# Patient Record
Sex: Male | Born: 1950 | Hispanic: No | Marital: Married | Smoking: Former smoker
Health system: Southern US, Community
[De-identification: ages and names within clinical notes are randomized; demographics above are authoritative.]

## PROBLEM LIST (undated history)

## (undated) DIAGNOSIS — E785 Hyperlipidemia, unspecified: Secondary | ICD-10-CM

## (undated) DIAGNOSIS — I519 Heart disease, unspecified: Secondary | ICD-10-CM

## (undated) DIAGNOSIS — I4510 Unspecified right bundle-branch block: Secondary | ICD-10-CM

## (undated) DIAGNOSIS — I2510 Atherosclerotic heart disease of native coronary artery without angina pectoris: Secondary | ICD-10-CM

## (undated) DIAGNOSIS — I219 Acute myocardial infarction, unspecified: Secondary | ICD-10-CM

## (undated) DIAGNOSIS — I10 Essential (primary) hypertension: Secondary | ICD-10-CM

## (undated) HISTORY — DX: Atherosclerotic heart disease of native coronary artery without angina pectoris: I25.10

## (undated) HISTORY — DX: Heart disease, unspecified: I51.9

## (undated) HISTORY — DX: Hyperlipidemia, unspecified: E78.5

## (undated) HISTORY — DX: Unspecified right bundle-branch block: I45.10

---

## 2012-01-27 ENCOUNTER — Encounter (HOSPITAL_COMMUNITY): Payer: Self-pay | Admitting: Emergency Medicine

## 2012-01-27 ENCOUNTER — Emergency Department (HOSPITAL_COMMUNITY)
Admission: EM | Admit: 2012-01-27 | Discharge: 2012-01-27 | Disposition: A | Discharge status: Home / Self Care | Payer: 59 | Attending: Emergency Medicine | Admitting: Emergency Medicine

## 2012-01-27 DIAGNOSIS — N289 Disorder of kidney and ureter, unspecified: Secondary | ICD-10-CM

## 2012-01-27 DIAGNOSIS — Z79899 Other long term (current) drug therapy: Secondary | ICD-10-CM | POA: Insufficient documentation

## 2012-01-27 DIAGNOSIS — R5381 Other malaise: Secondary | ICD-10-CM | POA: Insufficient documentation

## 2012-01-27 DIAGNOSIS — Z7982 Long term (current) use of aspirin: Secondary | ICD-10-CM | POA: Insufficient documentation

## 2012-01-27 DIAGNOSIS — R6883 Chills (without fever): Secondary | ICD-10-CM | POA: Insufficient documentation

## 2012-01-27 DIAGNOSIS — R42 Dizziness and giddiness: Secondary | ICD-10-CM | POA: Insufficient documentation

## 2012-01-27 DIAGNOSIS — E7800 Pure hypercholesterolemia, unspecified: Secondary | ICD-10-CM | POA: Insufficient documentation

## 2012-01-27 DIAGNOSIS — I10 Essential (primary) hypertension: Secondary | ICD-10-CM | POA: Insufficient documentation

## 2012-01-27 DIAGNOSIS — I252 Old myocardial infarction: Secondary | ICD-10-CM | POA: Insufficient documentation

## 2012-01-27 HISTORY — DX: Acute myocardial infarction, unspecified: I21.9

## 2012-01-27 HISTORY — DX: Essential (primary) hypertension: I10

## 2012-01-27 LAB — BASIC METABOLIC PANEL
BUN: 30 mg/dL — ABNORMAL HIGH (ref 6–23)
Calcium: 9.1 mg/dL (ref 8.4–10.5)
Creatinine, Ser: 1.68 mg/dL — ABNORMAL HIGH (ref 0.50–1.35)
GFR calc Af Amer: 50 mL/min — ABNORMAL LOW (ref >90)
GFR calc non Af Amer: 43 mL/min — ABNORMAL LOW (ref >90)
Glucose, Bld: 146 mg/dL — ABNORMAL HIGH (ref 70–99)
Potassium: 3.4 mEq/L — ABNORMAL LOW (ref 3.5–5.1)

## 2012-01-27 LAB — CBC
HCT: 43.2 % (ref 39.0–52.0)
Hemoglobin: 13.7 g/dL (ref 13.0–17.0)
MCH: 20.3 pg — ABNORMAL LOW (ref 26.0–34.0)
MCHC: 31.7 g/dL (ref 30.0–36.0)
MCV: 64 fL — ABNORMAL LOW (ref 78.0–100.0)
RDW: 18.9 % — ABNORMAL HIGH (ref 11.5–15.5)

## 2012-01-27 LAB — POCT I-STAT TROPONIN I

## 2012-01-27 MED ORDER — SODIUM CHLORIDE 0.9 % IV SOLN: Freq: Once | INTRAVENOUS | Status: AC

## 2012-01-27 MED ORDER — SODIUM CHLORIDE 0.9 % IV BOLUS (SEPSIS)
1000.0000 mL | Freq: Once | INTRAVENOUS | Status: AC
  Administered 2012-01-27: 1000 mL via INTRAVENOUS

## 2012-01-27 NOTE — ED Notes (Addendum)
Pt states he thinks he is having a heart attack, c/o dizziness and weakness, denies chest pain but states he had a MI last June with no chest pain, states this feels similar except he is not having jaw pain. Pt to triage for EKG.

## 2012-01-27 NOTE — ED Notes (Signed)
Dr. Weldon Inches in to talk to pt and family.  Pt denies any chest pain at this time.

## 2012-01-27 NOTE — ED Provider Notes (Addendum)
History     CSN: [id number]  Arrival date & time 01/27/12  4098   First MD Initiated Contact with Patient 01/27/12 2008      Chief Complaint  Patient presents with  . Fatigue    (Consider location/radiation/quality/duration/timing/severity/associated sxs/prior treatment) The history is provided by the patient.   the patient is a 61 year old, male, with a history of myocardial infarction, hypertension, and hypercholesterolemia.  He presents emergency department for violation after he had a shaking chill today, about 2:00 in afternoon.  He denied pain anywhere.  He denied nausea, vomiting.  He did not have any shortness of breath, or sweating.  He has not had a cough.  He has not been sick over the past few, days.  Because he has a history of myocardial infarction, that presented as only, jaw pain.  His wife, was concerned and insisted that he come to the emergency department for evaluation.  Presently, he is asymptomatic.  He does not smoke cigarettes.  Past Medical History  Diagnosis Date  . MI (myocardial infarction)   . Hypertension     History reviewed. No pertinent past surgical history.  No family history on file.  History  Substance Use Topics  . Smoking status: Former Games developer  . Smokeless tobacco: Not on file  . Alcohol Use: No      Review of Systems  Constitutional: Positive for chills. Negative for fever.  HENT: Negative for congestion.   Respiratory: Negative for cough, chest tightness and shortness of breath.   Cardiovascular: Negative for chest pain and palpitations.  Gastrointestinal: Negative for nausea, vomiting and abdominal pain.  Skin: Negative for rash.  Neurological: Positive for light-headedness. Negative for headaches.  Psychiatric/Behavioral: Negative for confusion.  All other systems reviewed and are negative.    Allergies  Review of patient's allergies indicates no known allergies.  Home Medications   Current Outpatient Rx  Name Route  Sig Dispense Refill  . ALLOPURINOL 300 MG PO TABS Oral Take 300 mg by mouth daily.    . ASPIRIN 325 MG PO TABS Oral Take 325 mg by mouth daily.    . ATORVASTATIN CALCIUM 40 MG PO TABS Oral Take 40 mg by mouth daily.    Marland Kitchen CLOPIDOGREL BISULFATE 75 MG PO TABS Oral Take 75 mg by mouth daily.    . COLCHICINE 0.6 MG PO TABS Oral Take 0.6 mg by mouth daily.    Marland Kitchen PEPCID AC PO Oral Take 1 tablet by mouth daily.    . OMEGA-3 FATTY ACIDS 1000 MG PO CAPS Oral Take 2 g by mouth daily.    Marland Kitchen HYDROCHLOROTHIAZIDE 12.5 MG PO CAPS Oral Take 12.5 mg by mouth daily.    Marland Kitchen METOPROLOL TARTRATE 50 MG PO TABS Oral Take 25 mg by mouth 2 (two) times daily.    Marland Kitchen OLMESARTAN MEDOXOMIL 40 MG PO TABS Oral Take 40 mg by mouth daily.      BP 153/93  Pulse 80  Temp 98.3 F (36.8 C) (Oral)  Resp 24  SpO2 99%  Physical Exam  Nursing note and vitals reviewed. Constitutional: He is oriented to person, place, and time. He appears well-developed and well-nourished. No distress.  HENT:  Head: Normocephalic and atraumatic.  Eyes: Conjunctivae are normal.  Neck: Normal range of motion. Neck supple.  Cardiovascular: Normal rate.   No murmur heard. Pulmonary/Chest: Effort normal. He has no rales.  Abdominal: Soft. Bowel sounds are normal. He exhibits no distension. There is no tenderness.  Musculoskeletal: Normal range of  motion. He exhibits no edema.  Neurological: He is alert and oriented to person, place, and time.  Skin: Skin is warm and dry.  Psychiatric: He has a normal mood and affect. Thought content normal.    ED Course  Procedures (including critical care time)  Labs Reviewed  CBC - Abnormal; Notable for the following:    WBC 12.0 (*)     RBC 6.75 (*)     MCV 64.0 (*)     MCH 20.3 (*)     RDW 18.9 (*)     All other components within normal limits  BASIC METABOLIC PANEL - Abnormal; Notable for the following:    Potassium 3.4 (*)     Glucose, Bld 146 (*)     BUN 30 (*)     Creatinine, Ser 1.68 (*)      GFR calc non Af Amer 43 (*)     GFR calc Af Amer 50 (*)     All other components within normal limits  GLUCOSE, CAPILLARY - Abnormal; Notable for the following:    Glucose-Capillary 131 (*)     All other components within normal limits  POCT I-STAT TROPONIN I   No results found.   1. Renal insufficiency    ECG. Normal sinus rhythm at 78 beats per minute. Normal axis. Left atrial enlargement. Q waves inferiorly. Normal sinus rhythm with left atrial enlargement, and inferior Q waves, no dysrhythmias or signs of acute ischemia   MDM  Chills Mild renal insufficiency No evidence of ACS.  No pain anywhere.  The patient is in no distress.  His physical examination is completely normal.  There is no indication for admission to the hospital.        Cheri Guppy, MD 01/28/12 0025  Cheri Guppy, MD 03/02/12 1038

## 2012-01-27 NOTE — ED Notes (Signed)
Pt reports feeling tired and decreased appetite since 4pm.  States he had R sided chest pain that lasted approx 1 minute this morning.  Reports diaphoresis.

## 2012-01-27 NOTE — Discharge Instructions (Signed)
Your laboratory test show mild decrease in the function of your kidneys.  There is no evidence that she had had a heart attack.  Followup with the cardiologist, and internal medicine doctors for reevaluation.  Call for appointment time.  Return for worse symptoms, especially, pain.  In your jaw, or chest pain

## 2012-01-27 NOTE — ED Notes (Signed)
cbg 131 

## 2012-01-27 NOTE — ED Notes (Addendum)
Pt stated he very sleepy, slept for 30-40 minis, went to [employer] on way home drink 1/2 can coke and suddenly become very sweaty. Pt stated has been tired and very fatigue since 2 pm today. Took 2tab/325 ASA. Has a hx of heart attack in past and did not have any CP at that time. Pt stated lost of appetite,  Complain of abdominal pain.  Pt denies any cheat pain now

## 2012-03-24 HISTORY — PX: TRANSTHORACIC ECHOCARDIOGRAM: SHX275

## 2012-09-22 ENCOUNTER — Other Ambulatory Visit (HOSPITAL_COMMUNITY): Payer: Self-pay | Admitting: Cardiovascular Disease

## 2012-09-22 DIAGNOSIS — R0602 Shortness of breath: Secondary | ICD-10-CM

## 2012-09-22 DIAGNOSIS — I2510 Atherosclerotic heart disease of native coronary artery without angina pectoris: Secondary | ICD-10-CM

## 2012-10-01 ENCOUNTER — Ambulatory Visit (HOSPITAL_COMMUNITY)
Admission: RE | Admit: 2012-10-01 | Discharge: 2012-10-01 | Disposition: A | Discharge status: Home / Self Care | Payer: 59 | Source: Ambulatory Visit | Attending: Cardiovascular Disease | Admitting: Cardiovascular Disease

## 2012-10-01 DIAGNOSIS — I2510 Atherosclerotic heart disease of native coronary artery without angina pectoris: Secondary | ICD-10-CM

## 2012-10-01 DIAGNOSIS — R0602 Shortness of breath: Secondary | ICD-10-CM

## 2012-10-10 ENCOUNTER — Encounter (HOSPITAL_COMMUNITY): Payer: 59

## 2012-10-17 ENCOUNTER — Encounter (HOSPITAL_COMMUNITY): Payer: 59

## 2013-02-10 ENCOUNTER — Other Ambulatory Visit: Payer: Self-pay | Admitting: *Deleted

## 2013-02-10 MED ORDER — ATORVASTATIN CALCIUM 40 MG PO TABS: 40.0000 mg | ORAL_TABLET | Freq: Every day | ORAL | Status: DC

## 2013-02-10 NOTE — Telephone Encounter (Signed)
Rx was sent to pharmacy electronically. 

## 2013-02-12 ENCOUNTER — Other Ambulatory Visit: Payer: Self-pay | Admitting: Pharmacist Clinician (PhC)/ Clinical Pharmacy Specialist

## 2013-02-12 MED ORDER — ATORVASTATIN CALCIUM 40 MG PO TABS: 40.0000 mg | ORAL_TABLET | Freq: Every day | ORAL | Status: DC

## 2013-03-19 ENCOUNTER — Ambulatory Visit (HOSPITAL_COMMUNITY)
Admission: RE | Admit: 2013-03-19 | Discharge: 2013-03-19 | Disposition: A | Discharge status: Home / Self Care | Payer: 59 | Source: Ambulatory Visit | Attending: Cardiovascular Disease | Admitting: Cardiovascular Disease

## 2013-03-19 VITALS — Ht 72.0 in | Wt 172.0 lb

## 2013-03-19 DIAGNOSIS — I2510 Atherosclerotic heart disease of native coronary artery without angina pectoris: Secondary | ICD-10-CM

## 2013-03-19 DIAGNOSIS — R0602 Shortness of breath: Secondary | ICD-10-CM

## 2013-03-19 HISTORY — PX: CARDIOVASCULAR STRESS TEST: SHX262

## 2013-03-19 MED ORDER — TECHNETIUM TC 99M SESTAMIBI GENERIC - CARDIOLITE
30.0000 | Freq: Once | INTRAVENOUS | Status: AC | PRN
  Administered 2013-03-19: 30 via INTRAVENOUS

## 2013-03-19 MED ORDER — REGADENOSON 0.4 MG/5ML IV SOLN
0.4000 mg | Freq: Once | INTRAVENOUS | Status: AC
  Administered 2013-03-19: 0.4 mg via INTRAVENOUS

## 2013-03-19 MED ORDER — AMINOPHYLLINE 25 MG/ML IV SOLN
75.0000 mg | Freq: Once | INTRAVENOUS | Status: AC
  Administered 2013-03-19: 75 mg via INTRAVENOUS

## 2013-03-19 MED ORDER — TECHNETIUM TC 99M SESTAMIBI GENERIC - CARDIOLITE
10.0000 | Freq: Once | INTRAVENOUS | Status: AC | PRN
  Administered 2013-03-19: 10 via INTRAVENOUS

## 2013-03-19 NOTE — Procedures (Addendum)
[facility] [address] [place] [address] [phone]  Cardiology Nuclear Med Study  Daniel Hurst is a 62 y.o. male     MRN : [id number]     DOB: 20-Apr-1951  Procedure Date: 03/19/2013  Nuclear Med Background Indication for Stress Test:  PTCA Patency History:  MI 2012, PTCA 2012 Cardiac Risk Factors: Family History - CAD, History of Smoking, Hypertension and Lipids  Symptoms:  Chest Pain, DOE and SOB   Nuclear Pre-Procedure Caffeine/Decaff Intake:  7:00pm NPO After: 5:00am   IV Site: R Antecubital  IV 0.9% NS with Angio Cath:  22g  Chest Size (in):  40 IV Started by: Koren Shiver, CNMT  Height: 6' (1.829 m)  Cup Size: n/a  BMI:  Body mass index is 23.32 kg/(m^2). Weight:  172 lb (78.019 kg)   Tech Comments:  N/a     Nuclear Med Study 1 or 2 day study: 1 day  Stress Test Type:  Stress  Order Authorizing Provider:  Nanetta Batty, MD   Resting Radionuclide: Technetium 7m Sestamibi  Resting Radionuclide Dose: 10.8 mCi   Stress Radionuclide:  Technetium 5m Sestamibi  Stress Radionuclide Dose: 31.8 mCi           Stress Protocol Rest HR: 61 Stress HR: 83  Rest BP: 150/104 Stress BP: 173/109  Exercise Time (min): n/a METS: n/a          Dose of Adenosine (mg):  n/a Dose of Lexiscan: 0.4 mg  Dose of Atropine (mg): n/a Dose of Dobutamine: n/a mcg/kg/min (at max HR)  Stress Test Technologist: Ernestene Mention, CCT Nuclear Technologist: Koren Shiver, CNMT   Rest Procedure:  Myocardial perfusion imaging was performed at rest 45 minutes following the intravenous administration of Technetium 1m Sestamibi. Stress Procedure:  The patient received IV Lexiscan 0.4 mg over 15-seconds.  Technetium 14m Sestamibi injected at 30-seconds.  Due to patient's shortness of breath, dizziness, feeling flushed and stomach pains, he was given IV Aminophylline 75 mg. Symptoms were resolved during recovery. There were no significant  changes with Lexiscan.  Quantitative spect images were obtained after a 45 minute delay.  Transient Ischemic Dilatation (Normal <1.22):  1.06 Lung/Heart Ratio (Normal <0.45):  0.30 QGS EDV:  198 ml QGS ESV:  133 ml LV Ejection Fraction: 33%     Rest ECG: NSR, old inferior infarct  Stress ECG: No significant change from baseline ECG  QPS Raw Data Images:  Normal; no motion artifact; normal heart/lung ratio. Stress Images:  Extensive and severe perfusion defect in the inferior wall, inferior septum and apex. Rest Images:  Comparison with the stress images reveals no significant change. Subtraction (SDS):  No evidence of ischemia.  Impression Exercise Capacity:  Lexiscan with no exercise. BP Response:  Normal blood pressure response. Clinical Symptoms:  There is dyspnea. ECG Impression:  No significant ST segment change suggestive of ischemia. Comparison with Prior Nuclear Study: No significant change from previous study  Overall Impression:  Low risk stress nuclear study extensive inferior, inferoseptal and apical scar with little, if any, periinfarct ischemia..  LV Wall Motion:  Moderately depressed LV systolic function with global hypokinesis, probably apical dyskinesis.   Thurmon Fair, MD  03/19/2013 1:21 PM

## 2013-03-20 ENCOUNTER — Encounter: Payer: Self-pay | Admitting: Cardiovascular Disease

## 2013-04-02 ENCOUNTER — Encounter: Payer: Self-pay | Admitting: Physician Assistant

## 2013-04-02 DIAGNOSIS — I10 Essential (primary) hypertension: Secondary | ICD-10-CM | POA: Insufficient documentation

## 2013-04-02 DIAGNOSIS — E785 Hyperlipidemia, unspecified: Secondary | ICD-10-CM | POA: Insufficient documentation

## 2013-04-02 DIAGNOSIS — I2510 Atherosclerotic heart disease of native coronary artery without angina pectoris: Secondary | ICD-10-CM

## 2013-04-09 ENCOUNTER — Encounter: Payer: Self-pay | Admitting: Cardiovascular Disease

## 2013-04-09 ENCOUNTER — Ambulatory Visit (INDEPENDENT_AMBULATORY_CARE_PROVIDER_SITE_OTHER): Payer: 59 | Admitting: Cardiovascular Disease

## 2013-04-09 VITALS — BP 138/96 | HR 60 | Ht 67.0 in | Wt 173.0 lb

## 2013-04-09 DIAGNOSIS — I2510 Atherosclerotic heart disease of native coronary artery without angina pectoris: Secondary | ICD-10-CM

## 2013-04-09 DIAGNOSIS — I10 Essential (primary) hypertension: Secondary | ICD-10-CM

## 2013-04-09 DIAGNOSIS — E785 Hyperlipidemia, unspecified: Secondary | ICD-10-CM

## 2013-04-09 DIAGNOSIS — Z79899 Other long term (current) drug therapy: Secondary | ICD-10-CM

## 2013-04-09 MED ORDER — CARVEDILOL 6.25 MG PO TABS: 9.3750 mg | ORAL_TABLET | Freq: Two times a day (BID) | ORAL | Status: DC

## 2013-04-09 MED ORDER — ISOSORBIDE MONONITRATE ER 30 MG PO TB24: 30.0000 mg | ORAL_TABLET | Freq: Every day | ORAL | Status: DC

## 2013-04-09 NOTE — Progress Notes (Signed)
04/09/2013 Daniel Hurst   1951/07/13  [id number]  Primary Physician Pcp Not In System Primary Cardiologist: Daniel Gess MD Daniel Hurst   HPI:  The patient is a 62 year old thin-appearing married Asian male father of 2 who works as an Art gallery manager at [employer]. I last saw him 6 months ago after being referred by Daniel Hurst, ER physician a [facility], after presenting January 27, 2012, with shaking chills. His risk factors include hypertension, hyperlipidemia, and family history. He did have a myocardial infarction in June 2012 and underwent cardiac catheterization by Daniel Hurst on January 30, 2011, revealing an occluded RCA, an occluded LAD, and a patent circumflex and EF of 25%. He had a Myoview performed April 17, 2011, that showed scar in multiple territories. Echo performed in our office March 14, 2012, showed an EF of 35% to 45% with severe inferior wall hypokinesia as well as posterior wall hypokinesia. He had a severely dilated left atrium with a suspected patent foramen ovale. His other problems include hypertension, hyperlipidemia, and family history. He denies chest pain but does have significant dyspnea on exertion. Lipid profile performed March 25, 2012, revealed a total cholesterol of 94, LDL of 40, and HDL of 33. Since I saw him back in the office 09/17/12 he has done well. He denies chest pain and has mild dyspnea on exertion but this has not [place] limiting. This exercise frequently. A recent Myoview stress test performed last month showed scar in the LAD and RCA territories with without ischemia. The ejection fraction by quantitative gated SPECT was 33%.    Current Outpatient Prescriptions  Medication Sig Dispense Refill  . amLODipine (NORVASC) 5 MG tablet Take 5 mg by mouth daily.      Marland Kitchen aspirin 325 MG tablet Take 325 mg by mouth daily.      Marland Kitchen atorvastatin (LIPITOR) 40 MG tablet Take 1 tablet (40 mg total) by mouth daily.  30 tablet  2  . carvedilol  (COREG) 6.25 MG tablet Take 6.25 mg by mouth 2 (two) times daily.      . clopidogrel (PLAVIX) 75 MG tablet Take 75 mg by mouth daily.      . colchicine 0.6 MG tablet Take 0.6 mg by mouth daily.      . Famotidine (PEPCID AC PO) Take 1 tablet by mouth daily.      . fish oil-omega-3 fatty acids 1000 MG capsule Take 2 g by mouth daily.      . furosemide (LASIX) 40 MG tablet Take 40 mg by mouth daily.      Marland Kitchen losartan (COZAAR) 100 MG tablet Take 100 mg by mouth daily.       No current facility-administered medications for this visit.    No Known Allergies  History   Social History  . Marital Status: Married    Spouse Name: N/A    Number of Children: N/A  . Years of Education: N/A   Occupational History  . Not on file.   Social History Main Topics  . Smoking status: Former Games developer  . Smokeless tobacco: Not on file  . Alcohol Use: No  . Drug Use: No  . Sexual Activity:    Other Topics Concern  . Not on file   Social History Narrative  . No narrative on file     Review of Systems: General: negative for chills, fever, night sweats or weight changes.  Cardiovascular: negative for chest pain, dyspnea on exertion, edema, orthopnea, palpitations, paroxysmal nocturnal dyspnea  or shortness of breath Dermatological: negative for rash Respiratory: negative for cough or wheezing Urologic: negative for hematuria Abdominal: negative for nausea, vomiting, diarrhea, bright red blood per rectum, melena, or hematemesis Neurologic: negative for visual changes, syncope, or dizziness All other systems reviewed and are otherwise negative except as noted above.    Blood pressure 138/96, pulse 60, height 5\' 7"  (1.702 m), weight 173 lb (78.472 kg).  General appearance: alert and no distress Neck: no adenopathy, no carotid bruit, no JVD, supple, symmetrical, trachea midline and thyroid not enlarged, symmetric, no tenderness/mass/nodules Lungs: clear to auscultation bilaterally Heart: regular rate  and rhythm, S1, S2 normal, no murmur, click, rub or gallop Extremities: extremities normal, atraumatic, no cyanosis or edema  EKG not performed today  ASSESSMENT AND PLAN:   CAD (coronary artery disease):  cardiac catheterization June 2012 because of MI revealed occluded RCA and occluded LAD patent circumflex. Patient recently had a Myoview stress test that showed scar in the anterior wall apex inferior apex and septum. There is no reversible ischemia. The ejection fraction was 33%. He denies chest pain. He has mild shortness of breath. Continued medical therapy will be recommended. I'm going at a low dose oral nitrate and consider at adding hydralazine as well. We'll mildly up titrate his carvedilol.  HTN (hypertension) Systolic blood pressure today was 96. Amlodipine was recently added by his PCP. I'm going to increase his carvedilol from 6.25 mg twice a day to 9.375 mg twice a day. And add a low-dose oral nitrate (immature 30 mg). He will see a mid-level provider back in 4-6 weeks at which time we will reassess his blood pressure and consider adding hydralazine 25 mg by mouth twice a day.  HLD (hyperlipidemia) On statin therapy. We will recheck a lipid and liver profile      Daniel Gess MD [facility], [facility] 04/09/2013 9:20 AM

## 2013-04-09 NOTE — Assessment & Plan Note (Signed)
Systolic blood pressure today was 96. Amlodipine was recently added by his PCP. I'm going to increase his carvedilol from 6.25 mg twice a day to 9.375 mg twice a day. And add a low-dose oral nitrate (immature 30 mg). He will see a mid-level provider back in 4-6 weeks at which time we will reassess his blood pressure and consider adding hydralazine 25 mg by mouth twice a day.

## 2013-04-09 NOTE — Assessment & Plan Note (Signed)
Patient recently had a Myoview stress test that showed scar in the anterior wall apex inferior apex and septum. There is no reversible ischemia. The ejection fraction was 33%. He denies chest pain. He has mild shortness of breath. Continued medical therapy will be recommended. I'm going at a low dose oral nitrate and consider at adding hydralazine as well. We'll mildly up titrate his carvedilol.

## 2013-04-09 NOTE — Assessment & Plan Note (Signed)
On statin therapy. We will recheck a lipid and liver profile 

## 2013-04-09 NOTE — Patient Instructions (Signed)
  Your physician wants you to follow-up with him in : 6 months                                             and with an extender in : 4-6 weeks                    You will receive a reminder letter in the mail one month in advance. If you don't receive a letter, please call our office to schedule the follow-up appointment.   Your physician recommends that you return for lab work in: 1-2 weeks, fasting   Your physician has recommended you make the following change in your medication:  Increase the Coreg to 9.375mg  twice a day (one and one half tablets twice a day of 6.25mg ) Start Imdur 30mg  daily

## 2013-04-20 ENCOUNTER — Other Ambulatory Visit: Payer: Self-pay | Admitting: *Deleted

## 2013-04-20 MED ORDER — CLOPIDOGREL BISULFATE 75 MG PO TABS: 75.0000 mg | ORAL_TABLET | Freq: Every day | ORAL | Status: DC

## 2013-04-20 MED ORDER — LOSARTAN POTASSIUM 100 MG PO TABS: 100.0000 mg | ORAL_TABLET | Freq: Every day | ORAL | Status: DC

## 2013-04-20 NOTE — Telephone Encounter (Signed)
Rx was sent to pharmacy electronically. 

## 2013-04-22 LAB — LIPID PANEL
HDL: 30 mg/dL — ABNORMAL LOW (ref >39)
Total CHOL/HDL Ratio: 3.6 Ratio
VLDL: 27 mg/dL (ref 0–40)

## 2013-04-22 LAB — HEPATIC FUNCTION PANEL
AST: 21 U/L (ref 0–37)
Albumin: 4.5 g/dL (ref 3.5–5.2)
Total Bilirubin: 1 mg/dL (ref 0.3–1.2)

## 2013-05-04 ENCOUNTER — Encounter: Payer: Self-pay | Admitting: *Deleted

## 2013-05-12 ENCOUNTER — Ambulatory Visit (INDEPENDENT_AMBULATORY_CARE_PROVIDER_SITE_OTHER): Payer: 59 | Admitting: Cardiology

## 2013-05-12 ENCOUNTER — Encounter: Payer: Self-pay | Admitting: Cardiology

## 2013-05-12 VITALS — BP 132/80 | HR 60 | Ht 66.0 in | Wt 173.9 lb

## 2013-05-12 DIAGNOSIS — I2589 Other forms of chronic ischemic heart disease: Secondary | ICD-10-CM

## 2013-05-12 DIAGNOSIS — I255 Ischemic cardiomyopathy: Secondary | ICD-10-CM | POA: Insufficient documentation

## 2013-05-12 DIAGNOSIS — E785 Hyperlipidemia, unspecified: Secondary | ICD-10-CM

## 2013-05-12 DIAGNOSIS — I10 Essential (primary) hypertension: Secondary | ICD-10-CM

## 2013-05-12 DIAGNOSIS — I2510 Atherosclerotic heart disease of native coronary artery without angina pectoris: Secondary | ICD-10-CM

## 2013-05-12 MED ORDER — HYDRALAZINE HCL 25 MG PO TABS: 25.0000 mg | ORAL_TABLET | Freq: Two times a day (BID) | ORAL | Status: DC

## 2013-05-12 NOTE — Progress Notes (Signed)
05/12/2013   PCP: Pcp Not In System   Chief Complaint  Patient presents with  . Follow-up    1 month, last visit with JB; no complaints    Primary Cardiologist:Dr. 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 His risk factors include hypertension, hyperlipidemia, and family history. He did have a myocardial infarction in June 2012 and underwent cardiac catheterization by Dr. [name] on January 30, 2011, revealing an occluded RCA, an occluded LAD, and a patent circumflex and EF of 25%. He had a Myoview performed April 17, 2011, that showed scar in multiple territories. Echo performed in our office March 14, 2012, showed an EF of 35% to 45% with severe inferior wall hypokinesia as well as posterior wall hypokinesia. He had a severely dilated left atrium with a suspected patent foramen ovale. His other problems include hypertension, hyperlipidemia, and family history. He denies chest pain but does have significant dyspnea on exertion. Lipid profile performed 04/22/13  TChol 109, HDL 30, LDL 52, TG 135.   He denies chest pain and has mild dyspnea on exertion but this has not lifestyle limiting. He exercises three times a week.  A recent Myoview stress test performed 03/2013 showed scar in the LAD and RCA territories with without ischemia. The ejection fraction by quantitative gated SPECT was 33%. His carvedilol  Was increased to 9.375 BID.  He is here today for BP check and to add hydralazine if stable.   No Known Allergies  Current Outpatient Prescriptions  Medication Sig Dispense Refill  . amLODipine (NORVASC) 5 MG tablet Take 5 mg by mouth daily.      Marland Kitchen aspirin 325 MG tablet Take 325 mg by mouth daily.      Marland Kitchen atorvastatin (LIPITOR) 40 MG tablet Take 1 tablet (40 mg total) by mouth daily.  30 tablet  2  . carvedilol (COREG) 6.25 MG tablet Take 1.5 tablets (9.375 mg total) by mouth 2 (two) times daily with  a meal.  90 tablet  6  . clopidogrel (PLAVIX) 75 MG tablet Take 1 tablet (75 mg total) by mouth daily.  30 tablet  11  . colchicine 0.6 MG tablet Take 0.6 mg by mouth daily.      . Famotidine (PEPCID AC PO) Take 1 tablet by mouth daily.      . fish oil-omega-3 fatty acids 1000 MG capsule Take 2 g by mouth daily.      . furosemide (LASIX) 40 MG tablet Take 40 mg by mouth daily.      Marland Kitchen losartan (COZAAR) 100 MG tablet Take 1 tablet (100 mg total) by mouth daily.  30 tablet  11  . hydrALAZINE (APRESOLINE) 25 MG tablet Take 1 tablet (25 mg total) by mouth 2 (two) times daily.  180 tablet  3   No current facility-administered medications for this visit.    Past Medical History  Diagnosis Date  . MI (myocardial infarction)   . Hypertension   . HLD (hyperlipidemia)   . CAD (coronary artery disease)   . Left ventricular dysfunction     Past Surgical History  Procedure Laterality Date  . Cardiovascular stress test  03/19/13    stress nuclear study extensive inferior, inferoseptal and apical scar with little, if any, periinfarct ischemia..  . Transthoracic echocardiogram  03/24/2012    ejection fraction 35-45%. Apical akinesis. Severe inferior wall hypokinesis. Severe posterior wall hypokinesis.  Supected patent foramen ovale    WUJ:WJXBJYN:WG colds or fevers, no weight changes Skin:no rashes or ulcers HEENT:no blurred vision, no congestion CV:see HPI PUL:see HPI GI:no diarrhea constipation or melena, no indigestion GU:no hematuria, no dysuria MS:no joint pain, no claudication Neuro:no syncope, no lightheadedness Endo:no diabetes, no thyroid disease  PHYSICAL EXAM BP 132/80  Pulse 60  Ht 5\' 6"  (1.676 m)  Wt 173 lb 14.4 oz (78.881 kg)  BMI 28.08 kg/m2 General:Pleasant affect, NAD Skin:Warm and dry, brisk capillary refill HEENT:normocephalic, sclera clear, mucus membranes moist Neck:supple, no JVD, no bruits, no adenopathy  Heart:S1S2 RRR without murmur, gallup, rub or  click Lungs:clear without rales, rhonchi, or wheezes NFA:OZHY, non tender, + BS, do not palpate liver spleen or masses Ext:no lower ext edema, 2+ pedal pulses, 2+ radial pulses Neuro:alert and oriented, MAE, follows commands, + facial symmetry  EKG:SR no acute changes HR 60, inf and ant lat Q wave changes old inf and old ant lat MI.  ASSESSMENT AND PLAN Cardiomyopathy, ischemic, EF by Echo 03/24/2012 35-40% Have added hydralazine to medication list, 25 mg BID as per Dr. Allyson Sabal.  EF on nuc study was 30%.  Will titrate meds and recheck echo.  If EF continues to be low would consider ICD.  CAD (coronary artery disease):  cardiac catheterization June 2012 because of MI revealed occluded RCA and occluded LAD patent circumflex. No chest pain.  No complaints.  EKG stable.  Last nuclear stress test was August of 2014: Overall Impression: Low risk stress nuclear study extensive inferior, inferoseptal and apical scar with little, if any, periinfarct ischemia..  LV Wall Motion: Moderately depressed LV systolic function with global hypokinesis, probably apical dyskinesis.    HTN (hypertension) Improved control. Hydralazine added today.  HLD (hyperlipidemia) Last lipid profile was 04/22/13  TChol 109, HDL 30, LDL 52, TG 135  I will see back in 4 weeks to check BP.  If stable will plan echo for November to eval EF on improved meds.

## 2013-05-12 NOTE — Assessment & Plan Note (Signed)
No chest pain.  No complaints.  EKG stable.  Last nuclear stress test was August of 2014: Overall Impression: Low risk stress nuclear study extensive inferior, inferoseptal and apical scar with little, if any, periinfarct ischemia..  LV Wall Motion: Moderately depressed LV systolic function with global hypokinesis, probably apical dyskinesis.

## 2013-05-12 NOTE — Patient Instructions (Addendum)
Follow up in 4-6 weeks to check your blood pressure, with Vernona Rieger.   Start hydralazine 25mg  twice daily.  If you have lightheadedness or dizziness after beginning hydralazine call our office.

## 2013-05-12 NOTE — Assessment & Plan Note (Signed)
Improved control. Hydralazine added today.

## 2013-05-12 NOTE — Assessment & Plan Note (Signed)
Last lipid profile was 04/22/13  TChol 109, HDL 30, LDL 52, TG 135

## 2013-05-12 NOTE — Assessment & Plan Note (Signed)
Have added hydralazine to medication list, 25 mg BID as per Dr. Allyson Sabal.  EF on nuc study was 30%.  Will titrate meds and recheck echo.  If EF continues to be low would consider ICD.

## 2013-05-18 ENCOUNTER — Other Ambulatory Visit: Payer: Self-pay | Admitting: Cardiovascular Disease

## 2013-05-18 NOTE — Telephone Encounter (Signed)
Rx was sent to pharmacy electronically. 

## 2013-07-01 ENCOUNTER — Ambulatory Visit (INDEPENDENT_AMBULATORY_CARE_PROVIDER_SITE_OTHER): Payer: 59 | Admitting: Cardiology

## 2013-07-01 ENCOUNTER — Encounter: Payer: Self-pay | Admitting: Cardiology

## 2013-07-01 VITALS — BP 135/79 | HR 59 | Ht 66.0 in | Wt 172.0 lb

## 2013-07-01 DIAGNOSIS — I2510 Atherosclerotic heart disease of native coronary artery without angina pectoris: Secondary | ICD-10-CM

## 2013-07-01 DIAGNOSIS — I10 Essential (primary) hypertension: Secondary | ICD-10-CM

## 2013-07-01 DIAGNOSIS — I428 Other cardiomyopathies: Secondary | ICD-10-CM

## 2013-07-01 DIAGNOSIS — I252 Old myocardial infarction: Secondary | ICD-10-CM

## 2013-07-01 DIAGNOSIS — I255 Ischemic cardiomyopathy: Secondary | ICD-10-CM

## 2013-07-01 DIAGNOSIS — I2589 Other forms of chronic ischemic heart disease: Secondary | ICD-10-CM

## 2013-07-01 DIAGNOSIS — I429 Cardiomyopathy, unspecified: Secondary | ICD-10-CM

## 2013-07-01 NOTE — Assessment & Plan Note (Signed)
No chest pain no SOB 

## 2013-07-01 NOTE — Progress Notes (Signed)
07/01/2013   PCP: Pcp Not In System   Chief Complaint  Patient presents with  . Follow-up    6 week visit per laura BP check    Primary Cardiologist:Dr. Allyson Sabal  HPI : 62 year old thin-appearing married Asian male father of 2 who works as an Art gallery manager at [employer]. His risk factors include hypertension, hyperlipidemia, and family history. He did have a myocardial infarction in June 2012 and underwent cardiac catheterization by Dr. [name] on January 30, 2011, revealing an occluded RCA, an occluded LAD, and a patent circumflex and EF of 25%. He had a Myoview performed April 17, 2011, that showed scar in multiple territories. Echo performed in our office March 14, 2012, showed an EF of 35% to 45% with severe inferior wall hypokinesia as well as posterior wall hypokinesia. He had a severely dilated left atrium with a suspected patent foramen ovale. His other problems include hypertension, hyperlipidemia, and family history. He denies chest pain but does have significant dyspnea on exertion. Lipid profile performed 04/22/13 TChol 109, HDL 30, LDL 52, TG 135. He denies chest pain and has mild dyspnea on exertion but this has not lifestyle limiting. He exercises three times a week. A recent Myoview stress test performed 03/2013 showed scar in the LAD and RCA territories with without ischemia. The ejection fraction by quantitative gated SPECT was 33%.  His carvedilol Was increased to 9.375 BID. In Oct I saw and added hydralazine.  He is back today for recheck and to order echo to reeval. EF if still low would need to discuss ICD.  No complaints, tolerating Hydralazine well.  HR borderline unable to increase BB.     No Known Allergies  Current Outpatient Prescriptions  Medication Sig Dispense Refill  . amLODipine (NORVASC) 5 MG tablet Take 5 mg by mouth daily.      Marland Kitchen aspirin 325 MG tablet Take 325 mg by mouth daily.      Marland Kitchen atorvastatin (LIPITOR) 40 MG tablet TAKE 1 TABLET (40  MG TOTAL) BY MOUTH DAILY.  30 tablet  11  . carvedilol (COREG) 6.25 MG tablet Take 1.5 tablets (9.375 mg total) by mouth 2 (two) times daily with a meal.  90 tablet  6  . clopidogrel (PLAVIX) 75 MG tablet Take 1 tablet (75 mg total) by mouth daily.  30 tablet  11  . colchicine 0.6 MG tablet Take 0.6 mg by mouth daily.      . Famotidine (PEPCID AC PO) Take 1 tablet by mouth daily.      . fish oil-omega-3 fatty acids 1000 MG capsule Take 2 g by mouth daily.      . furosemide (LASIX) 40 MG tablet Take 40 mg by mouth daily.      . hydrALAZINE (APRESOLINE) 25 MG tablet Take 1 tablet (25 mg total) by mouth 2 (two) times daily.  180 tablet  3  . losartan (COZAAR) 100 MG tablet Take 1 tablet (100 mg total) by mouth daily.  30 tablet  11  . NITROSTAT 0.4 MG SL tablet        No current facility-administered medications for this visit.    Past Medical History  Diagnosis Date  . MI (myocardial infarction)   . Hypertension   . HLD (hyperlipidemia)   . CAD (coronary artery disease)   . Left ventricular dysfunction     Past Surgical History  Procedure Laterality Date  . Cardiovascular stress test  03/19/13  stress nuclear study extensive inferior, inferoseptal and apical scar with little, if any, periinfarct ischemia..  . Transthoracic echocardiogram  03/24/2012    ejection fraction 35-45%. Apical akinesis. Severe inferior wall hypokinesis. Severe posterior wall hypokinesis. Supected patent foramen ovale    ZOX:WRUEAVW:UJ colds or fevers, no weight changes CV:see HPI PUL:see HPI Neuro:no syncope, no lightheadedness   PHYSICAL EXAM BP 135/79  Pulse 59  Ht 5\' 6"  (1.676 m)  Wt 172 lb (78.019 kg)  BMI 27.77 kg/m2 General:Pleasant affect, NAD Skin:Warm and dry, brisk capillary refill Heart:S1S2 RRR without murmur, gallup, rub or click Lungs:clear without rales, rhonchi, or wheezes Ext:no lower ext edema, 2+ pedal pulses, 2+ radial pulses Neuro:alert and oriented, MAE, follows commands, +  facial symmetry   ASSESSMENT AND PLAN Cardiomyopathy, ischemic, EF by Echo 03/24/2012 35-40% Will recheck echo now on mor appropriate meds.  If EF still low would consider ICD. Cannot increase coreg due to borderline HR.  Follow up with Dr. Allyson Sabal after ECHO  CAD (coronary artery disease):  cardiac catheterization June 2012 because of MI revealed occluded RCA and occluded LAD patent circumflex. No chest pain no SOB.  HTN (hypertension) controlled  He requested GI group in town for screening.  His PCP is in [facility] and he did not wish to have this done in [place].  We will refer to [facility].

## 2013-07-01 NOTE — Patient Instructions (Signed)
We will repeat your Echo of your heart, then you will follow up with Dr. Allyson Sabal for results  Our schedulers will schedule an appointment with [facility] for screening.  Call if any problems.

## 2013-07-01 NOTE — Assessment & Plan Note (Addendum)
Will recheck echo now on mor appropriate meds.  If EF still low would consider ICD. Cannot increase coreg due to borderline HR.  Follow up with Dr. Allyson Sabal after ECHO

## 2013-07-01 NOTE — Assessment & Plan Note (Signed)
controlled 

## 2013-07-27 ENCOUNTER — Ambulatory Visit (HOSPITAL_COMMUNITY)
Admission: RE | Admit: 2013-07-27 | Discharge: 2013-07-27 | Disposition: A | Discharge status: Home / Self Care | Payer: 59 | Source: Ambulatory Visit | Attending: Cardiology | Admitting: Cardiology

## 2013-07-27 DIAGNOSIS — I252 Old myocardial infarction: Secondary | ICD-10-CM | POA: Insufficient documentation

## 2013-07-27 DIAGNOSIS — I10 Essential (primary) hypertension: Secondary | ICD-10-CM | POA: Insufficient documentation

## 2013-07-27 DIAGNOSIS — I428 Other cardiomyopathies: Secondary | ICD-10-CM | POA: Insufficient documentation

## 2013-07-27 DIAGNOSIS — I429 Cardiomyopathy, unspecified: Secondary | ICD-10-CM

## 2013-07-27 DIAGNOSIS — I2510 Atherosclerotic heart disease of native coronary artery without angina pectoris: Secondary | ICD-10-CM

## 2013-07-27 DIAGNOSIS — I359 Nonrheumatic aortic valve disorder, unspecified: Secondary | ICD-10-CM | POA: Insufficient documentation

## 2013-07-27 NOTE — Progress Notes (Signed)
2D Echo Performed 07/27/2013    Tavaris Eudy, RCS  

## 2013-08-03 ENCOUNTER — Ambulatory Visit: Payer: 59 | Admitting: Cardiovascular Disease

## 2013-08-04 ENCOUNTER — Ambulatory Visit (INDEPENDENT_AMBULATORY_CARE_PROVIDER_SITE_OTHER): Payer: 59 | Admitting: Cardiovascular Disease

## 2013-08-04 ENCOUNTER — Encounter: Payer: Self-pay | Admitting: Cardiovascular Disease

## 2013-08-04 VITALS — BP 122/72 | HR 68 | Ht 66.0 in | Wt 171.5 lb

## 2013-08-04 DIAGNOSIS — I2510 Atherosclerotic heart disease of native coronary artery without angina pectoris: Secondary | ICD-10-CM

## 2013-08-04 MED ORDER — CARVEDILOL 12.5 MG PO TABS: 12.5000 mg | ORAL_TABLET | Freq: Two times a day (BID) | ORAL | Status: DC

## 2013-08-04 NOTE — Progress Notes (Signed)
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 I last saw him 6 months ago after being referred by Dr. Cheri Guppy, ER physician a [facility], after presenting January 27, 2012, with shaking chills. His risk factors include hypertension, hyperlipidemia, and family history. He did have a myocardial infarction in June 2012 and underwent cardiac catheterization by Dr. [name] on January 30, 2011, revealing an occluded RCA, an occluded LAD, and a patent circumflex and EF of 25%. He had a Myoview performed April 17, 2011, that showed scar in multiple territories. Echo performed in our office March 14, 2012, showed an EF of 35% to 45% with severe inferior wall hypokinesia as well as posterior wall hypokinesia. He had a severely dilated left atrium with a suspected patent foramen ovale. His other problems include hypertension, hyperlipidemia, and family history. He denies chest pain but does have significant dyspnea on exertion. Lipid profile performed March 25, 2012, revealed a total cholesterol of 94, LDL of 40, and HDL of 33.  Since I saw him back in the office 09/17/12 he has done well. He denies chest pain and has mild dyspnea on exertion but this has not Lescol limiting. This exercise frequently. A recent Myoview stress test performed last month showed scar in the LAD and RCA territories with without ischemia. The ejection fraction by quantitative gated SPECT was 33%. I saw him in the office noncustomer/14 and titrated his beta blocker. Followup 2-D echo 07/29/13 revealed an increase in LVEF to be 45-50% range.    Current Outpatient Prescriptions  Medication Sig Dispense Refill  . amLODipine (NORVASC) 5 MG tablet Take 5 mg by mouth daily.      Marland Kitchen aspirin 325 MG  tablet Take 325 mg by mouth daily.      Marland Kitchen atorvastatin (LIPITOR) 40 MG tablet TAKE 1 TABLET (40 MG TOTAL) BY MOUTH DAILY.  30 tablet  11  . carvedilol (COREG) 6.25 MG tablet Take 1.5 tablets (9.375 mg total) by mouth 2 (two) times daily with a meal.  90 tablet  6  . clopidogrel (PLAVIX) 75 MG tablet Take 1 tablet (75 mg total) by mouth daily.  30 tablet  11  . colchicine 0.6 MG tablet Take 0.6 mg by mouth daily.      . Famotidine (PEPCID AC PO) Take 1 tablet by mouth daily.      . fish oil-omega-3 fatty acids 1000 MG capsule Take 2 g by mouth daily.      . furosemide (LASIX) 40 MG tablet Take 40 mg by mouth daily.      . hydrALAZINE (APRESOLINE) 25 MG tablet Take 1 tablet (25 mg total) by mouth 2 (two) times daily.  180 tablet  3  . losartan (COZAAR) 100 MG tablet Take 1 tablet (100 mg total) by mouth daily.  30 tablet  11  . NITROSTAT 0.4 MG SL tablet Place 0.4 mg under the tongue every 5 (five) minutes as needed.        No current facility-administered medications for this visit.    No Known Allergies  History   Social History  . Marital Status: Married    Spouse Name: N/A    Number of Children: N/A  . Years of Education: N/A   Occupational History  .  Not on file.   Social History Main Topics  . Smoking status: Former Smoker    Quit date: 05/12/1981  . Smokeless tobacco: Never Used  . Alcohol Use: No  . Drug Use: No  . Sexual Activity: Not on file   Other Topics Concern  . Not on file   Social History Narrative  . No narrative on file     Review of Systems: General: negative for chills, fever, night sweats or weight changes.  Cardiovascular: negative for chest pain, dyspnea on exertion, edema, orthopnea, palpitations, paroxysmal nocturnal dyspnea or shortness of breath Dermatological: negative for rash Respiratory: negative for cough or wheezing Urologic: negative for hematuria Abdominal: negative for nausea, vomiting, diarrhea, bright red blood per rectum, melena,  or hematemesis Neurologic: negative for visual changes, syncope, or dizziness All other systems reviewed and are otherwise negative except as noted above.    Blood pressure 122/72, pulse 68, height 5\' 6"  (1.676 m), weight 171 lb 8 oz (77.792 kg).  General appearance: alert Neck: no adenopathy, no carotid bruit, no JVD, supple, symmetrical, trachea midline and thyroid not enlarged, symmetric, no tenderness/mass/nodules Lungs: clear to auscultation bilaterally Heart: regular rate and rhythm, S1, S2 normal, no murmur, click, rub or gallop Extremities: extremities normal, atraumatic, no cyanosis or edema  EKG not performed today  ASSESSMENT AND PLAN:   Cardiomyopathy, ischemic, EF by Echo 03/24/2012 35-40% I saw Mr. Monier back in the office 04/09/13. He has had coronary intervention and myocardial infarction in the past. He has scar in his LAD and RCA territories with an EF in the 35-45% by prior echo performed 03/14/2012. I uptitrated his carvedilol and a repeat echo showed EF of 45-50% with corresponding dullness no normalities. He has mild dyspnea but denies chest pain. I'm going to increase his carvedilol from 9.375-12.5 mg twice a day and will have him seen back in 6 months.  HLD (hyperlipidemia) On statin therapy with recent lipid profile performed none/17/14 revealing a total cholesterol of 19 and LDL of 52 and HDL of 30  HTN (hypertension) Well-controlled on current medications      [name] MD [facility], [facility] 08/04/2013 10:47 AM

## 2013-08-04 NOTE — Assessment & Plan Note (Signed)
On statin therapy with recent lipid profile performed none/17/14 revealing a total cholesterol of 19 and LDL of 52 and HDL of 30

## 2013-08-04 NOTE — Assessment & Plan Note (Signed)
I saw Daniel Hurst back in the office 04/09/13. He has had coronary intervention and myocardial infarction in the past. He has scar in his LAD and RCA territories with an EF in the 35-45% by prior echo performed 03/14/2012. I uptitrated his carvedilol and a repeat echo showed EF of 45-50% with corresponding dullness no normalities. He has mild dyspnea but denies chest pain. I'm going to increase his carvedilol from 9.375-12.5 mg twice a day and will have him seen back in 6 months.

## 2013-08-04 NOTE — Assessment & Plan Note (Signed)
Well-controlled on current medications 

## 2013-08-04 NOTE — Patient Instructions (Signed)
  We will see you back in follow up in 6 months with an extender and 1 year with Dr Allyson Sabal  Dr Allyson Sabal has ordered for you to increase your Coreg to 12.5mg  twice a day

## 2013-08-12 ENCOUNTER — Telehealth: Payer: Self-pay | Admitting: *Deleted

## 2013-08-12 NOTE — Telephone Encounter (Signed)
Dr Allyson SabalBerry reviewed the chart and gave clearance to hold asa and plavix.  [facility]

## 2014-03-16 ENCOUNTER — Other Ambulatory Visit: Payer: Self-pay

## 2014-03-16 MED ORDER — CARVEDILOL 12.5 MG PO TABS: 12.5000 mg | ORAL_TABLET | Freq: Two times a day (BID) | ORAL | Status: DC

## 2014-03-16 NOTE — Telephone Encounter (Signed)
Rx was sent to pharmacy electronically. 

## 2014-03-30 ENCOUNTER — Other Ambulatory Visit: Payer: Self-pay

## 2014-03-30 MED ORDER — HYDRALAZINE HCL 25 MG PO TABS: 25.0000 mg | ORAL_TABLET | Freq: Two times a day (BID) | ORAL | Status: DC

## 2014-03-30 NOTE — Telephone Encounter (Signed)
Rx was sent to pharmacy electronically. 

## 2014-04-14 ENCOUNTER — Other Ambulatory Visit: Payer: Self-pay

## 2014-04-14 MED ORDER — CLOPIDOGREL BISULFATE 75 MG PO TABS: 75.0000 mg | ORAL_TABLET | Freq: Every day | ORAL | Status: DC

## 2014-04-14 NOTE — Telephone Encounter (Signed)
Rx refill sent to pharmacy. 

## 2014-05-13 ENCOUNTER — Other Ambulatory Visit: Payer: Self-pay | Admitting: *Deleted

## 2014-05-13 MED ORDER — ATORVASTATIN CALCIUM 40 MG PO TABS: ORAL_TABLET | ORAL | Status: DC

## 2014-05-13 NOTE — Telephone Encounter (Signed)
Medication refilled electronically with notation: *APPOINTMENT NEEDED FOR FURTHER REFILLS*

## 2014-07-07 ENCOUNTER — Encounter: Payer: Self-pay | Admitting: Cardiovascular Disease

## 2014-07-07 ENCOUNTER — Ambulatory Visit (INDEPENDENT_AMBULATORY_CARE_PROVIDER_SITE_OTHER): Payer: 59 | Admitting: Cardiovascular Disease

## 2014-07-07 VITALS — BP 112/82 | HR 64 | Ht 66.0 in | Wt 171.8 lb

## 2014-07-07 DIAGNOSIS — I2510 Atherosclerotic heart disease of native coronary artery without angina pectoris: Secondary | ICD-10-CM

## 2014-07-07 DIAGNOSIS — E785 Hyperlipidemia, unspecified: Secondary | ICD-10-CM

## 2014-07-07 DIAGNOSIS — I255 Ischemic cardiomyopathy: Secondary | ICD-10-CM

## 2014-07-07 DIAGNOSIS — I2583 Coronary atherosclerosis due to lipid rich plaque: Secondary | ICD-10-CM

## 2014-07-07 DIAGNOSIS — I10 Essential (primary) hypertension: Secondary | ICD-10-CM

## 2014-07-07 NOTE — Patient Instructions (Signed)
Your physician wants you to follow-up in 1 year with Dr. Berry. You will receive a reminder letter in the mail 2 months in advance. If you do not receive a letter, please call our office to schedule the follow-up appointment.  

## 2014-07-07 NOTE — Assessment & Plan Note (Signed)
History of CAD status post cardiac catheterization performed by Dr. Fransisco HertzKirkland 01/30/11 revealing an occluded RCA and LAD, patent circumflex with severe LV dysfunction. His last Myoview stress test showing scars and multiple vascular territories without ischemia. The patient is asymptomatic.

## 2014-07-07 NOTE — Assessment & Plan Note (Signed)
History of hypertension with blood pressure measured today at 112/82. He is on carvedilol , losartan and amlodipine. Continue current medications at current dosing.

## 2014-07-07 NOTE — Progress Notes (Signed)
07/07/2014 Daniel Hurst   [birth year]  [id number]  Primary Physician Pcp Not In System Primary Cardiologist: Runell GessJonathan J. 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 I last saw him 12 months ago after being referred by Dr. Cheri GuppyJeffrey Caporossi[facility], after presenting January 27, 2012, with shaking chills. His risk factors include hypertension, hyperlipidemia, and family history. He did have a myocardial infarction in June 2012 and underwent cardiac catheterization by Dr. Carlos LeveringStephen Kirkland on January 30, 2011, revealing an occluded RCA, an occluded LAD, and a patent circumflex and EF of 25%. He had a Myoview performed April 17, 2011, that showed scar in multiple territories. Echo performed in our office March 14, 2012, showed an EF of 35% to 45% with severe inferior wall hypokinesia as well as posterior wall hypokinesia. He had a severely dilated left atrium with a suspected patent foramen ovale. His other problems include hypertension, hyperlipidemia, and family history. He denies chest pain but does have significant dyspnea on exertion. Lipid profile performed March 25, 2012, revealed a total cholesterol of 94, LDL of 40, and HDL of 33.  Since I saw him back in the office 09/17/12 he has done well. He denies chest pain and has mild dyspnea on exertion but this has not Lescol limiting. This exercise frequently. A recent Myoview stress test performed last month showed scar in the LAD and RCA territories with without ischemia. The ejection fraction by quantitative gated SPECT was 33%.  After titrating his beta blocker his ejection fraction by 2-D echo performed 07/29/13 at increased up to the 45-50% range. Since I saw him one year ago he's remained asymptomatic  Current Outpatient Prescriptions  Medication Sig Dispense Refill  . amLODipine (NORVASC) 5 MG tablet Take 5 mg by mouth  daily.    Marland Kitchen. aspirin 325 MG tablet Take 325 mg by mouth daily.    Marland Kitchen. atorvastatin (LIPITOR) 40 MG tablet TAKE 1 TABLET (40 MG TOTAL) BY MOUTH DAILY. *APPOINTMENT NEEDED FOR FURTHER REFILLS* 30 tablet 1  . carvedilol (COREG) 12.5 MG tablet Take 1 tablet (12.5 mg total) by mouth 2 (two) times daily with a meal. 60 tablet 4  . clopidogrel (PLAVIX) 75 MG tablet Take 1 tablet (75 mg total) by mouth daily. *APPOINTMENT NEEDED FOR FURTHER REFILLS* 30 tablet 2  . colchicine 0.6 MG tablet Take 0.6 mg by mouth daily.    . Famotidine (PEPCID AC PO) Take 1 tablet by mouth daily.    . fish oil-omega-3 fatty acids 1000 MG capsule Take 2 g by mouth daily.    . furosemide (LASIX) 40 MG tablet Take 40 mg by mouth daily.    . hydrALAZINE (APRESOLINE) 25 MG tablet Take 1 tablet (25 mg total) by mouth 2 (two) times daily. 60 tablet 4  . losartan (COZAAR) 100 MG tablet Take 1 tablet (100 mg total) by mouth daily. 30 tablet 11  . Nepafenac 0.3 % SUSP Apply to eye.    Marland Kitchen. NITROSTAT 0.4 MG SL tablet Place 0.4 mg under the tongue every 5 (five) minutes as needed.     . RESTASIS 0.05 % ophthalmic emulsion     . sildenafil (VIAGRA) 50 MG tablet TAKE 1/2 TO 1 TABLET BY MOUTH 1 HOUR BEFORE SEXUAL ACTIVITY     No current facility-administered medications for this visit.    Allergies  Allergen Reactions  .  Ace Inhibitors     History   Social History  . Marital Status: Married    Spouse Name: N/A    Number of Children: N/A  . Years of Education: N/A   Occupational History  . Not on file.   Social History Main Topics  . Smoking status: Former Smoker    Quit date: 05/12/1981  . Smokeless tobacco: Never Used  . Alcohol Use: No  . Drug Use: No  . Sexual Activity: Not on file   Other Topics Concern  . Not on file   Social History Narrative     Review of Systems: General: negative for chills, fever, night sweats or weight changes.  Cardiovascular: negative for chest pain, dyspnea on exertion, edema,  orthopnea, palpitations, paroxysmal nocturnal dyspnea or shortness of breath Dermatological: negative for rash Respiratory: negative for cough or wheezing Urologic: negative for hematuria Abdominal: negative for nausea, vomiting, diarrhea, bright red blood per rectum, melena, or hematemesis Neurologic: negative for visual changes, syncope, or dizziness All other systems reviewed and are otherwise negative except as noted above.    Blood pressure 112/82, pulse 64, height 5\' 6"  (1.676 m), weight 171 lb 12.8 oz (77.928 kg).  General appearance: alert and no distress Neck: no adenopathy, no carotid bruit, no JVD, supple, symmetrical, trachea midline and thyroid not enlarged, symmetric, no tenderness/mass/nodules Lungs: clear to auscultation bilaterally Heart: regular rate and rhythm, S1, S2 normal, no murmur, click, rub or gallop Extremities: extremities normal, atraumatic, no cyanosis or edema  EKG normal sinus rhythm at 64 with inferior Q waves as well as anterolateral Q waves. I proceeded review this EKG  ASSESSMENT AND PLAN:   Cardiomyopathy, ischemic, EF by Echo 03/24/2012 35-40% History of ischemic cardiac cardiomyopathy with ejection fraction back in 2013 measured at 35-40% but most recently echo performed 07/29/13 revealed the ejection fraction had increased moderately in the 45-50% range. He is asymptomatic on appropriate medications including beta blocker and ARB.  CAD (coronary artery disease):  cardiac catheterization June 2012 because of MI revealed occluded RCA and occluded LAD patent circumflex. History of CAD status post cardiac catheterization performed by Dr. Fransisco HertzKirkland 01/30/11 revealing an occluded RCA and LAD, patent circumflex with severe LV dysfunction. His last Myoview stress test showing scars and multiple vascular territories without ischemia. The Hurst is asymptomatic.  HTN (hypertension) History of hypertension with blood pressure measured today at 112/82. He is on  carvedilol , losartan and amlodipine. Continue current medications at current dosing.  HLD (hyperlipidemia) History of hyperlipidemia on atorvastatin 40 mg a day [place].      Runell GessJonathan J. Kasson Lamere MD FACP,FACC,FAHA, [facility] 07/07/2014 11:17 AM

## 2014-07-07 NOTE — Assessment & Plan Note (Signed)
History of hyperlipidemia on atorvastatin 40 mg a day followed [place].

## 2014-07-07 NOTE — Assessment & Plan Note (Signed)
History of ischemic cardiac cardiomyopathy with ejection fraction back in 2013 measured at 35-40% but most recently echo performed 07/29/13 revealed the ejection fraction had increased moderately in the 45-50% range. He is asymptomatic on appropriate medications including beta blocker and ARB.

## 2014-07-09 ENCOUNTER — Other Ambulatory Visit: Payer: Self-pay | Admitting: Cardiovascular Disease

## 2014-07-09 ENCOUNTER — Other Ambulatory Visit: Payer: Self-pay

## 2014-07-09 MED ORDER — LOSARTAN POTASSIUM 100 MG PO TABS: 100.0000 mg | ORAL_TABLET | Freq: Every day | ORAL | Status: DC

## 2014-07-09 NOTE — Telephone Encounter (Signed)
Rx has been sent to the pharmacy electronically. ° °

## 2014-07-09 NOTE — Telephone Encounter (Signed)
Rx sent to pharmacy   

## 2014-07-13 ENCOUNTER — Encounter: Payer: Self-pay | Admitting: Cardiovascular Disease

## 2014-07-14 ENCOUNTER — Encounter: Payer: Self-pay | Admitting: *Deleted

## 2014-08-13 ENCOUNTER — Other Ambulatory Visit: Payer: Self-pay | Admitting: Cardiovascular Disease

## 2014-08-13 NOTE — Telephone Encounter (Signed)
Rx(s) sent to pharmacy electronically.  

## 2014-12-06 ENCOUNTER — Other Ambulatory Visit: Payer: Self-pay

## 2014-12-06 MED ORDER — HYDRALAZINE HCL 25 MG PO TABS: 25.0000 mg | ORAL_TABLET | Freq: Two times a day (BID) | ORAL | Status: DC

## 2014-12-06 NOTE — Telephone Encounter (Signed)
Rx(s) sent to pharmacy electronically.  

## 2015-02-11 ENCOUNTER — Other Ambulatory Visit: Payer: Self-pay | Admitting: Cardiovascular Disease

## 2015-02-11 NOTE — Telephone Encounter (Signed)
Rx has been sent to the pharmacy electronically. ° °

## 2015-06-04 ENCOUNTER — Other Ambulatory Visit: Payer: Self-pay | Admitting: Cardiovascular Disease

## 2015-07-27 ENCOUNTER — Other Ambulatory Visit: Payer: Self-pay | Admitting: Cardiovascular Disease

## 2015-07-27 NOTE — Telephone Encounter (Signed)
Rx(s) sent to pharmacy electronically.  

## 2015-08-28 ENCOUNTER — Other Ambulatory Visit: Payer: Self-pay | Admitting: Cardiovascular Disease

## 2015-09-26 ENCOUNTER — Other Ambulatory Visit: Payer: Self-pay

## 2015-09-26 ENCOUNTER — Other Ambulatory Visit: Payer: Self-pay | Admitting: Cardiovascular Disease

## 2015-09-26 MED ORDER — CARVEDILOL 12.5 MG PO TABS: 12.5000 mg | ORAL_TABLET | Freq: Two times a day (BID) | ORAL | Status: DC

## 2015-09-26 NOTE — Telephone Encounter (Signed)
Rx request sent to pharmacy.  

## 2015-09-26 NOTE — Telephone Encounter (Signed)
Rx(s) sent to pharmacy electronically.  

## 2015-10-11 ENCOUNTER — Encounter: Payer: Self-pay | Admitting: Cardiovascular Disease

## 2015-10-11 ENCOUNTER — Ambulatory Visit (INDEPENDENT_AMBULATORY_CARE_PROVIDER_SITE_OTHER): Payer: 59 | Admitting: Cardiovascular Disease

## 2015-10-11 VITALS — BP 120/70 | HR 59 | Ht 66.0 in | Wt 175.0 lb

## 2015-10-11 DIAGNOSIS — E785 Hyperlipidemia, unspecified: Secondary | ICD-10-CM | POA: Diagnosis not present

## 2015-10-11 DIAGNOSIS — I159 Secondary hypertension, unspecified: Secondary | ICD-10-CM | POA: Diagnosis not present

## 2015-10-11 MED ORDER — NITROGLYCERIN 0.4 MG SL SUBL: 0.4000 mg | SUBLINGUAL_TABLET | SUBLINGUAL | Status: DC | PRN

## 2015-10-11 MED ORDER — CLOPIDOGREL BISULFATE 75 MG PO TABS: 75.0000 mg | ORAL_TABLET | Freq: Every day | ORAL | Status: DC

## 2015-10-11 MED ORDER — LOSARTAN POTASSIUM 100 MG PO TABS: 100.0000 mg | ORAL_TABLET | Freq: Every day | ORAL | Status: DC

## 2015-10-11 MED ORDER — HYDRALAZINE HCL 25 MG PO TABS: 25.0000 mg | ORAL_TABLET | Freq: Two times a day (BID) | ORAL | Status: DC

## 2015-10-11 MED ORDER — ATORVASTATIN CALCIUM 40 MG PO TABS: 40.0000 mg | ORAL_TABLET | Freq: Every day | ORAL | Status: DC

## 2015-10-11 MED ORDER — CARVEDILOL 12.5 MG PO TABS: 12.5000 mg | ORAL_TABLET | Freq: Two times a day (BID) | ORAL | Status: DC

## 2015-10-11 NOTE — Assessment & Plan Note (Signed)
History of CAD with a known occluded LAD and RCA with patent circumflex by cath 01/30/2011. He has a nonischemic ischemic Myoview with ejection fraction that has increased after medical therapy to a 45-50% range 07/27/13. He is asymptomatic.

## 2015-10-11 NOTE — Patient Instructions (Signed)
Medication Instructions:  Your physician recommends that you continue on your current medications as directed. Please refer to the Current Medication list given to you today.   Labwork: Your physician recommends that you return for lab work in: FASTING (lipid/liver) The lab can be found on the FIRST FLOOR of out building in [address]   Testing/Procedures: none  Follow-Up: Your physician wants you to follow-up in: 12 months with Dr. Berry. You will receive a reminder letter in the mail two months in advance. If you don't receive a letter, please call our office to schedule the follow-up appointment.   Any Other Special Instructions Will Be Listed Below (If Applicable).     If you need a refill on your cardiac medications before your next appointment, please call your pharmacy.   

## 2015-10-11 NOTE — Progress Notes (Signed)
10/11/2015 Daniel Hurst   04/26/1951  [id number]  Primary Physician [name], MD Primary Cardiologist: [name] MD [name]   HPI:  The patient is a 65 year old thin-appearing married Asian male father of 2 who works as an Art gallery manager at [employer]. I last saw him 07/07/14. His risk factors include hypertension, hyperlipidemia, and family history. He did have a myocardial infarction in June 2012 and underwent cardiac catheterization by Dr. [name] on January 30, 2011, revealing an occluded RCA, an occluded LAD, and a patent circumflex and EF of 25%. He had a Myoview performed April 17, 2011, that showed scar in multiple territories. Echo performed in our office March 14, 2012, showed an EF of 35% to 45% with severe inferior wall hypokinesia as well as posterior wall hypokinesia. He had a severely dilated left atrium with a suspected patent foramen ovale. His other problems include hypertension, hyperlipidemia, and family history. He denies chest pain but does have significant dyspnea on exertion. Lipid profile performed March 25, 2012, revealed a total cholesterol of 94, LDL of 40, and HDL of 33.  Since I saw him back in the office 09/17/12 he has done well. He denies chest pain and has mild dyspnea on exertion but this has not Lescol limiting. This exercise frequently. A recent Myoview stress test performed last month showed scar in the LAD and RCA territories with without ischemia. The ejection fraction by quantitative gated SPECT was 33%.  After titrating his beta blocker his ejection fraction by 2-D echo performed 07/29/13 at increased up to the 45-50% range. Since I saw him one year ago he's remained asymptomatic   Current Outpatient Prescriptions  Medication Sig Dispense Refill  . amLODipine (NORVASC) 5 MG tablet Take 5 mg by mouth daily.    Marland Kitchen aspirin 325 MG tablet Take 325 mg by mouth daily.    Marland Kitchen atorvastatin (LIPITOR) 40 MG tablet TAKE 1 TABLET(S) BY  MOUTH DAILY 30 tablet 0  . carvedilol (COREG) 12.5 MG tablet Take 1 tablet (12.5 mg total) by mouth 2 (two) times daily with a meal. MUST KEEP APPOINTMENT 10/11/2015 WITH DR BERRY FOR FUTURE REFILLS 30 tablet 0  . clopidogrel (PLAVIX) 75 MG tablet TAKE 1 TABLET(S) BY MOUTH DAILY 30 tablet 0  . colchicine 0.6 MG tablet Take 0.6 mg by mouth daily.    . Famotidine (PEPCID AC PO) Take 1 tablet by mouth daily.    . fish oil-omega-3 fatty acids 1000 MG capsule Take 2 g by mouth daily.    . furosemide (LASIX) 40 MG tablet Take 40 mg by mouth daily.    . hydrALAZINE (APRESOLINE) 25 MG tablet Take 1 tablet (25 mg total) by mouth 2 (two) times daily. 60 tablet 7  . losartan (COZAAR) 100 MG tablet Take 1 tablet (100 mg total) by mouth daily. 30 tablet 0  . NITROSTAT 0.4 MG SL tablet Place 0.4 mg under the tongue every 5 (five) minutes as needed.     . RESTASIS 0.05 % ophthalmic emulsion     . sildenafil (VIAGRA) 50 MG tablet TAKE 1/2 TO 1 TABLET BY MOUTH 1 HOUR BEFORE SEXUAL ACTIVITY     No current facility-administered medications for this visit.    Allergies  Allergen Reactions  . Ace Inhibitors     Social History   Social History  . Marital Status: Married    Spouse Name: N/A  . Number of Children: N/A  . Years of Education: N/A   Occupational History  .  Not on file.   Social History Main Topics  . Smoking status: Former Smoker    Quit date: 05/12/1981  . Smokeless tobacco: Never Used  . Alcohol Use: No  . Drug Use: No  . Sexual Activity: Not on file   Other Topics Concern  . Not on file   Social History Narrative     Review of Systems: General: negative for chills, fever, night sweats or weight changes.  Cardiovascular: negative for chest pain, dyspnea on exertion, edema, orthopnea, palpitations, paroxysmal nocturnal dyspnea or shortness of breath Dermatological: negative for rash Respiratory: negative for cough or wheezing Urologic: negative for hematuria Abdominal:  negative for nausea, vomiting, diarrhea, bright red blood per rectum, melena, or hematemesis Neurologic: negative for visual changes, syncope, or dizziness All other systems reviewed and are otherwise negative except as noted above.    Blood pressure 120/70, pulse 59, height 5\' 6"  (1.676 m), weight 175 lb (79.379 kg).  General appearance: alert and no distress Neck: no adenopathy, no carotid bruit, no JVD, supple, symmetrical, trachea midline and thyroid not enlarged, symmetric, no tenderness/mass/nodules Lungs: clear to auscultation bilaterally Heart: regular rate and rhythm, S1, S2 normal, no murmur, click, rub or gallop Extremities: extremities normal, atraumatic, no cyanosis or edema  EKG sinus bradycardia at 59 with right bundle branch block, anterolateral Q waves and inferior Q waves. Pursue reviewed this EKG  ASSESSMENT AND PLAN:   CAD (coronary artery disease):  cardiac catheterization June 2012 because of MI revealed occluded RCA and occluded LAD patent circumflex. History of CAD with a known occluded LAD and RCA with patent circumflex by cath 01/30/2011. He has a nonischemic ischemic Myoview with ejection fraction that has increased after medical therapy to a 45-50% range 07/27/13. He is asymptomatic.  HTN (hypertension) History of hypertension with blood pressure measured at 120/70. He is on amlodipine, carvedilol, losartan and hydralazine. Continue current meds at current dosing  HLD (hyperlipidemia) History of hyperlipidemia on atorvastatin. We will recheck a lipid and liver profile      Runell GessJonathan J. Berry MD [facility] LLCFACP,FACC,FAHA, [facility] Center[facility]SC[facility] 10/11/2015 4:52 PM

## 2015-10-11 NOTE — Assessment & Plan Note (Signed)
History of hyperlipidemia on atorvastatin. We will recheck a lipid and liver profile 

## 2015-10-11 NOTE — Assessment & Plan Note (Signed)
History of hypertension with blood pressure measured at 120/70. He is on amlodipine, carvedilol, losartan and hydralazine. Continue current meds at current dosing

## 2015-10-12 LAB — LIPID PANEL
CHOLESTEROL: 124 mg/dL — AB (ref 125–200)
HDL: 32 mg/dL — ABNORMAL LOW (ref >=40)
LDL Cholesterol: 62 mg/dL (ref <130)
TRIGLYCERIDES: 149 mg/dL (ref <150)
Total CHOL/HDL Ratio: 3.9 Ratio (ref <=5.0)
VLDL: 30 mg/dL (ref <30)

## 2015-10-12 LAB — HEPATIC FUNCTION PANEL
ALT: 24 U/L (ref 9–46)
AST: 20 U/L (ref 10–35)
Albumin: 4.1 g/dL (ref 3.6–5.1)
Alkaline Phosphatase: 43 U/L (ref 40–115)
BILIRUBIN DIRECT: 0.1 mg/dL (ref <=0.2)
Indirect Bilirubin: 0.5 mg/dL (ref 0.2–1.2)
Total Bilirubin: 0.6 mg/dL (ref 0.2–1.2)
Total Protein: 6.4 g/dL (ref 6.1–8.1)

## 2015-10-14 ENCOUNTER — Telehealth: Payer: Self-pay | Admitting: Cardiovascular Disease

## 2015-10-14 NOTE — Telephone Encounter (Signed)
Spoke with pt, aware his ECG showed a RBBB. Questions answered.

## 2015-10-14 NOTE — Telephone Encounter (Signed)
New message      Talk to the nurse about his ekg from his last visit.  Please call

## 2015-11-02 ENCOUNTER — Other Ambulatory Visit: Payer: Self-pay | Admitting: Cardiovascular Disease

## 2015-11-03 NOTE — Telephone Encounter (Signed)
REFILL 

## 2015-12-13 ENCOUNTER — Other Ambulatory Visit: Payer: Self-pay | Admitting: *Deleted

## 2015-12-13 MED ORDER — CARVEDILOL 12.5 MG PO TABS: 12.5000 mg | ORAL_TABLET | Freq: Two times a day (BID) | ORAL | Status: DC

## 2016-07-04 ENCOUNTER — Other Ambulatory Visit: Payer: Self-pay | Admitting: Cardiovascular Disease

## 2016-09-02 ENCOUNTER — Other Ambulatory Visit: Payer: Self-pay | Admitting: Cardiovascular Disease

## 2016-10-23 ENCOUNTER — Encounter: Payer: Self-pay | Admitting: Cardiovascular Disease

## 2016-10-23 ENCOUNTER — Ambulatory Visit (INDEPENDENT_AMBULATORY_CARE_PROVIDER_SITE_OTHER): Payer: 59 | Admitting: Cardiovascular Disease

## 2016-10-23 VITALS — BP 130/76 | HR 63 | Ht 66.0 in | Wt 176.0 lb

## 2016-10-23 DIAGNOSIS — E7800 Pure hypercholesterolemia, unspecified: Secondary | ICD-10-CM

## 2016-10-23 DIAGNOSIS — I519 Heart disease, unspecified: Secondary | ICD-10-CM

## 2016-10-23 DIAGNOSIS — I10 Essential (primary) hypertension: Secondary | ICD-10-CM

## 2016-10-23 DIAGNOSIS — I2510 Atherosclerotic heart disease of native coronary artery without angina pectoris: Secondary | ICD-10-CM

## 2016-10-23 DIAGNOSIS — I255 Ischemic cardiomyopathy: Secondary | ICD-10-CM

## 2016-10-23 MED ORDER — ATORVASTATIN CALCIUM 40 MG PO TABS: 40.0000 mg | ORAL_TABLET | Freq: Every day | ORAL | 3 refills | Status: DC

## 2016-10-23 MED ORDER — CLOPIDOGREL BISULFATE 75 MG PO TABS: ORAL_TABLET | ORAL | 3 refills | Status: DC

## 2016-10-23 MED ORDER — HYDRALAZINE HCL 25 MG PO TABS: 25.0000 mg | ORAL_TABLET | Freq: Two times a day (BID) | ORAL | 11 refills | Status: DC

## 2016-10-23 MED ORDER — FUROSEMIDE 40 MG PO TABS: 40.0000 mg | ORAL_TABLET | Freq: Every day | ORAL | 3 refills | Status: DC

## 2016-10-23 MED ORDER — NITROGLYCERIN 0.4 MG SL SUBL: 0.4000 mg | SUBLINGUAL_TABLET | SUBLINGUAL | 11 refills | Status: DC | PRN

## 2016-10-23 MED ORDER — CARVEDILOL 12.5 MG PO TABS
12.5000 mg | ORAL_TABLET | Freq: Two times a day (BID) | ORAL | 3 refills | Status: DC
Start: 2016-10-23 — End: 2016-12-04

## 2016-10-23 MED ORDER — AMLODIPINE BESYLATE 5 MG PO TABS: 5.0000 mg | ORAL_TABLET | Freq: Every day | ORAL | 3 refills | Status: DC

## 2016-10-23 MED ORDER — LOSARTAN POTASSIUM 100 MG PO TABS: 100.0000 mg | ORAL_TABLET | Freq: Every day | ORAL | 3 refills | Status: DC

## 2016-10-23 NOTE — Assessment & Plan Note (Signed)
History of CAD status post myocardial infarction June 2012 with catheterization performed by Dr. Samuella BruinSteven Hurst 01/30/11 revealing an occluded RCA, occluded LAD and a patent circumflex coronary artery with an EF of 25%. Myoview performed 04/17/11 showed scar in multiple vascular territories and echo performed 03/14/12 revealed an EF of 35-45% with wall motion abnormalities. This ultimately improved by 2-D echo a year later on optimal medical therapy.

## 2016-10-23 NOTE — Patient Instructions (Signed)
Medication Instructions: Your physician recommends that you continue on your current medications as directed. Please refer to the Current Medication list given to you today.   Testing/Procedures: Your physician has requested that you have an echocardiogram. Echocardiography is a painless test that uses sound waves to create images of your heart. It provides your doctor with information about the size and shape of your heart and how well your heart's chambers and valves are working. This procedure takes approximately one hour. There are no restrictions for this procedure.  Follow-Up: Your physician wants you to follow-up in: 6 months with Dr. Berry. You will receive a reminder letter in the mail two months in advance. If you don't receive a letter, please call our office to schedule the follow-up appointment.  If you need a refill on your cardiac medications before your next appointment, please call your pharmacy.  

## 2016-10-23 NOTE — Addendum Note (Signed)
Addended by: Evans LanceSTOVER, Michael Ventresca W on: 10/23/2016 08:54 AM   Modules accepted: Orders

## 2016-10-23 NOTE — Progress Notes (Signed)
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 His risk factors include hypertension, hyperlipidemia, and family history. He did have a myocardial infarction in June 2012 and underwent cardiac catheterization by Dr. Carlos Hurst on January 30, 2011, revealing an occluded RCA, an occluded LAD, and a patent circumflex and EF of 25%. He had a Myoview performed April 17, 2011, that showed scar in multiple territories. Echo performed in our office March 14, 2012, showed an EF of 35% to 45% with severe inferior wall hypokinesia as well as posterior wall hypokinesia. He had a severely dilated left atrium with a suspected patent foramen ovale. His other problems include hypertension, hyperlipidemia, and family history. He denies chest pain but does have significant dyspnea on exertion. Lipid profile performed March 25, 2012, revealed a total cholesterol of 94, LDL of 40, and HDL of 33.  Since I saw him back in the office 09/17/12 he has done well. He denies chest pain and has mild dyspnea on exertion but this has not Lescol limiting. This exercise frequently. A recent Myoview stress test performed last month showed scar in the LAD and RCA territories with without ischemia. The ejection fraction by quantitative gated SPECT was 33%.  After titrating his beta blocker his ejection fraction by 2-D echo performed 07/29/13 at increased up to the 45-50% range. Since I saw him one year ago he's remained asymptomatic   Current Outpatient Prescriptions  Medication Sig Dispense Refill  . allopurinol (ZYLOPRIM) 300 MG tablet Take 1 tablet by mouth daily.    Marland Kitchen amLODipine (NORVASC) 5 MG tablet Take 5 mg by mouth daily.    Marland Kitchen aspirin 325 MG tablet Take 325 mg by  mouth daily.    Marland Kitchen atorvastatin (LIPITOR) 40 MG tablet TAKE 1 TABLET(S) BY MOUTH DAILY 30 tablet 11  . carvedilol (COREG) 12.5 MG tablet take 1 tablet by mouth twice a day WITH MEAL 180 tablet 2  . clopidogrel (PLAVIX) 75 MG tablet TAKE 1 TABLET(S) BY MOUTH DAILY 30 tablet 11  . colchicine 0.6 MG tablet Take 0.6 mg by mouth daily.    . Famotidine (PEPCID AC PO) Take 1 tablet by mouth daily.    . fish oil-omega-3 fatty acids 1000 MG capsule Take 2 g by mouth daily.    . furosemide (LASIX) 40 MG tablet Take 40 mg by mouth daily.    . hydrALAZINE (APRESOLINE) 25 MG tablet Take 1 tablet (25 mg total) by mouth 2 (two) times daily. 60 tablet 11  . losartan (COZAAR) 100 MG tablet TAKE 1 TABLET (100 MG TOTAL) BY MOUTH DAILY. 30 tablet 11  . nitroGLYCERIN (NITROSTAT) 0.4 MG SL tablet Place 1 tablet (0.4 mg total) under the tongue every 5 (five) minutes as needed. 25 tablet 11  . RESTASIS 0.05 % ophthalmic emulsion     . sildenafil (VIAGRA) 50 MG tablet TAKE 1/2 TO 1 TABLET BY MOUTH 1 HOUR BEFORE SEXUAL ACTIVITY     No current facility-administered medications for this visit.     Allergies  Allergen Reactions  . Ace Inhibitors     Social History   Social History  . Marital status: Married    Spouse name: N/A  . Number of children: N/A  . Years of education:  N/A   Occupational History  . Not on file.   Social History Main Topics  . Smoking status: Former Smoker    Quit date: 05/12/1981  . Smokeless tobacco: Never Used  . Alcohol use No  . Drug use: No  . Sexual activity: Not on file   Other Topics Concern  . Not on file   Social History Narrative  . No narrative on file     Review of Systems: General: negative for chills, fever, night sweats or weight changes.  Cardiovascular: negative for chest pain, dyspnea on exertion, edema, orthopnea, palpitations, paroxysmal nocturnal dyspnea or shortness of breath Dermatological: negative for rash Respiratory: negative for cough or  wheezing Urologic: negative for hematuria Abdominal: negative for nausea, vomiting, diarrhea, bright red blood per rectum, melena, or hematemesis Neurologic: negative for visual changes, syncope, or dizziness All other systems reviewed and are otherwise negative except as noted above.    Blood pressure 130/76, pulse 63, height 5\' 6"  (1.676 m), weight 176 lb (79.8 kg).  General appearance: alert and no distress Neck: no adenopathy, no carotid bruit, no JVD, supple, symmetrical, trachea midline and thyroid not enlarged, symmetric, no tenderness/mass/nodules Lungs: clear to auscultation bilaterally Heart: regular rate and rhythm, S1, S2 normal, no murmur, click, rub or gallop Extremities: extremities normal, atraumatic, no cyanosis or edema  EKG normal sinus rhythm at 63 with right bundle branch block and occasional PVCs. Q waves and anterolateral Q waves. I personally reviewed this EKG.  ASSESSMENT AND PLAN:   Cardiomyopathy, ischemic, EF by Echo 03/24/2012 35-40% History of ischemic cardiomyopathy with ejection fraction last checked 07/29/13 revealing an ejection fraction of 45-50%. He is relatively asymptomatic on optimal medical therapy. Since been over 4 years since his last echo we will recheck a 2-D echo for LV function.  CAD (coronary artery disease):  cardiac catheterization June 2012 because of MI revealed occluded RCA and occluded LAD patent circumflex. History of CAD status post myocardial infarction June 2012 with catheterization performed by Daniel Hurst 01/30/11 revealing an occluded RCA, occluded LAD and a patent circumflex coronary artery with an EF of 25%. Myoview performed 04/17/11 showed scar in multiple vascular territories and echo performed 03/14/12 revealed an EF of 35-45% with wall motion abnormalities. This ultimately improved by 2-D echo a year later on optimal medical therapy.  HTN (hypertension) History of hypertension blood pressure measured 130/76. He is on  amlodipine, carvedilol, hydralazine and losartan. Continue current meds at current dosing  HLD (hyperlipidemia) History of hyperlipidemia on statin therapy followed by his PCP      Daniel GessJonathan J. Lathyn Griggs MD [facility] HospitalFACP,FACC,FAHA, [facility]FSCAI 10/23/2016 8:24 AM

## 2016-10-23 NOTE — Assessment & Plan Note (Signed)
History of hyperlipidemia on statin therapy followed by his PCP 

## 2016-10-23 NOTE — Assessment & Plan Note (Signed)
History of hypertension blood pressure measured 130/76. He is on amlodipine, carvedilol, hydralazine and losartan. Continue current meds at current dosing

## 2016-10-23 NOTE — Assessment & Plan Note (Signed)
History of ischemic cardiomyopathy with ejection fraction last checked 07/29/13 revealing an ejection fraction of 45-50%. He is relatively asymptomatic on optimal medical therapy. Since been over 4 years since his last echo we will recheck a 2-D echo for LV function.

## 2016-11-06 ENCOUNTER — Other Ambulatory Visit: Payer: Self-pay

## 2016-11-06 ENCOUNTER — Ambulatory Visit (HOSPITAL_COMMUNITY): Payer: 59 | Attending: Cardiology

## 2016-11-06 DIAGNOSIS — I519 Heart disease, unspecified: Secondary | ICD-10-CM | POA: Diagnosis not present

## 2016-11-06 DIAGNOSIS — I10 Essential (primary) hypertension: Secondary | ICD-10-CM | POA: Diagnosis not present

## 2016-11-06 DIAGNOSIS — I4510 Unspecified right bundle-branch block: Secondary | ICD-10-CM | POA: Diagnosis not present

## 2016-11-06 DIAGNOSIS — Z87891 Personal history of nicotine dependence: Secondary | ICD-10-CM | POA: Diagnosis not present

## 2016-11-06 DIAGNOSIS — I272 Pulmonary hypertension, unspecified: Secondary | ICD-10-CM | POA: Diagnosis not present

## 2016-11-06 DIAGNOSIS — I083 Combined rheumatic disorders of mitral, aortic and tricuspid valves: Secondary | ICD-10-CM | POA: Diagnosis not present

## 2016-11-06 MED ORDER — PERFLUTREN LIPID MICROSPHERE
1.0000 mL | INTRAVENOUS | Status: AC | PRN
  Administered 2016-11-06: 2 mL via INTRAVENOUS

## 2016-11-22 ENCOUNTER — Telehealth: Payer: Self-pay | Admitting: Cardiovascular Disease

## 2016-11-22 NOTE — Telephone Encounter (Signed)
Returned the call to the patient. Did not see any notes where someone had called and he stated he wasn't sure of a name.

## 2016-11-22 NOTE — Telephone Encounter (Signed)
Patient states that he is returning a call. Patient states that he does not know what missed call was in reference to.Thanks.

## 2016-11-23 ENCOUNTER — Telehealth: Payer: Self-pay | Admitting: Cardiovascular Disease

## 2016-11-23 NOTE — Telephone Encounter (Signed)
Closed encounter °

## 2016-12-04 ENCOUNTER — Ambulatory Visit (INDEPENDENT_AMBULATORY_CARE_PROVIDER_SITE_OTHER): Payer: 59 | Admitting: Cardiovascular Disease

## 2016-12-04 ENCOUNTER — Encounter: Payer: Self-pay | Admitting: Cardiovascular Disease

## 2016-12-04 DIAGNOSIS — I255 Ischemic cardiomyopathy: Secondary | ICD-10-CM

## 2016-12-04 MED ORDER — CARVEDILOL 12.5 MG PO TABS: 18.7500 mg | ORAL_TABLET | Freq: Two times a day (BID) | ORAL | 3 refills | Status: DC

## 2016-12-04 NOTE — Patient Instructions (Signed)
Medication Instructions:   INCREASE your carvedilol to 18.75mg  TWICE A DAY.  There is no 18.75mg  tablet. Take 1 and 1/2 tabs of the 12.5mg  strength to equal this dose.  Labwork: none ordered   Testing/Procedures: none ordered   Follow-Up: 6 months with Dr. Allyson Sabal  Keep a log of your blood pressure and heart rate daily. See our clinical pharmacist in 1 month for follow up on your blood pressure and heart rate.  If you need a refill on your cardiac medications before your next appointment, please call your pharmacy.

## 2016-12-04 NOTE — Assessment & Plan Note (Signed)
Mr Daniel Hurst returns today for follow-up of history echocardiogram done to assess LV function on 11/06/16. His EF has gone from 45-50% on 07/29/13 to 35-40% with mild LV dilatation and moderate AI. He is entirely asymptomatic. I'm going to increase his carvedilol from 12.5 mg twice a day to 18.75 mg by mouth twice a day with the intent to ultimately go up to 25 mg by mouth twice a day. He will keep heart rate and blood pressure log and a daily basis of decreased back in one month for evaluation and titration. I will see him back in 6 months.

## 2016-12-04 NOTE — Progress Notes (Signed)
Daniel Hurst returns today for follow-up of history echocardiogram done to assess LV function on 11/06/16. His EF has gone from 45-50% on 07/29/13 to 35-40% with mild LV dilatation and moderate AI. He is entirely asymptomatic. I'm going to increase his carvedilol from 12.5 mg twice a day to 18.75 mg by mouth twice a day with the intent to ultimately go up to 25 mg by mouth twice a day. He will keep heart rate and blood pressure log and a daily basis of decreased back in one month for evaluation and titration. I will see him back in 6 months. 

## 2017-01-09 ENCOUNTER — Ambulatory Visit (INDEPENDENT_AMBULATORY_CARE_PROVIDER_SITE_OTHER): Payer: 59 | Admitting: Pharmacist Clinician (PhC)/ Clinical Pharmacy Specialist

## 2017-01-09 DIAGNOSIS — E7800 Pure hypercholesterolemia, unspecified: Secondary | ICD-10-CM | POA: Diagnosis not present

## 2017-01-09 DIAGNOSIS — I255 Ischemic cardiomyopathy: Secondary | ICD-10-CM

## 2017-01-09 MED ORDER — CARVEDILOL 25 MG PO TABS: 25.0000 mg | ORAL_TABLET | Freq: Two times a day (BID) | ORAL | 3 refills | Status: DC

## 2017-01-09 NOTE — Assessment & Plan Note (Signed)
For now will see how much drop in home BP will get from increase in carvedilol.  Will also have him switch the amlodipine to evenings.  He will continue with daily blood pressure checks and bring his meter into the office at his next appointment in 3 weeks

## 2017-01-09 NOTE — Progress Notes (Signed)
01/09/2017 Daniel Hurst 1950/08/22 [id number]   HPI:  Daniel Hurst is a [age] y.o. male patient of Dr Allyson SabalBerry, with a PMH below who presents today for hypertension clinic evaluation and beta blocker titration.   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  His cardiac history is significant for MI in 2012, ischemic cardiomyopathy, hypertension and hyperlipidemia.  His most recent EF is down to 35-40% after going as high as 45-50% in 2014.   His biggest concerns are that he feels if his blood pressure and heart rate go much lower he will get poor perfusion in his eyes (he has glaucoma and cataracts), and that he takes too many medications.   Blood Pressure Goal:  130/80   Current Medications:  Amlodipine 5 mg qam  Losartan 100 mg qam  Hydralazine 25 mg bid  Carvedilol 18.75 mg bid  Family Hx:  Father - MI, stroke died at 8265  Mother - DM died at 6086  1 sister died with cancer,  Several other siblings with hypertension  Social Hx:  Previous smoker, quit in 1983; no alcohol, coffee 2 cups, no caffeinated sodas  Diet:  Mostly home cooked, no added salt, occasional fried foods  Exercise:  [employer] - eliptical, treadmill, walk, 3x week  Home BP readings:  Checked daily, occasionally twice daily.  34 readings with average of 137/81 and range 117-154/67-93.    Heart rate average 66 with range of 56-86  Intolerances:   ACEI - cough  Wt Readings from Last 3 Encounters:  12/04/16 177 lb 9.6 oz (80.6 kg)  10/23/16 176 lb (79.8 kg)  10/11/15 175 lb (79.4 kg)   BP Readings from Last 3 Encounters:  01/09/17 126/88  12/04/16 (!) 151/84  10/23/16 130/76   Pulse Readings from Last 3 Encounters:  01/09/17 60  12/04/16 66  10/23/16 63    Current Outpatient Prescriptions  Medication Sig Dispense Refill  . allopurinol (ZYLOPRIM) 300 MG tablet Take 1 tablet by mouth daily.    Marland Kitchen. amLODipine (NORVASC) 5 MG tablet Take 1 tablet (5 mg  total) by mouth daily. 90 tablet 3  . aspirin 325 MG tablet Take 325 mg by mouth daily.    Marland Kitchen. atorvastatin (LIPITOR) 40 MG tablet Take 1 tablet (40 mg total) by mouth daily at 6 PM. 90 tablet 3  . clopidogrel (PLAVIX) 75 MG tablet Take 1 tablet by mouth daily. 90 tablet 3  . colchicine 0.6 MG tablet Take 0.6 mg by mouth as needed.     . Famotidine (PEPCID AC PO) Take 1 tablet by mouth daily.    . fish oil-omega-3 fatty acids 1000 MG capsule Take 1 g by mouth daily.     . furosemide (LASIX) 40 MG tablet Take 1 tablet (40 mg total) by mouth daily. 90 tablet 3  . hydrALAZINE (APRESOLINE) 25 MG tablet Take 1 tablet (25 mg total) by mouth 2 (two) times daily. 60 tablet 11  . losartan (COZAAR) 100 MG tablet Take 1 tablet (100 mg total) by mouth daily. 90 tablet 3  . nitroGLYCERIN (NITROSTAT) 0.4 MG SL tablet Place 1 tablet (0.4 mg total) under the tongue every 5 (five) minutes as needed. 25 tablet 11  . RESTASIS 0.05 % ophthalmic emulsion     . sildenafil (VIAGRA) 50 MG tablet TAKE 1/2 TO 1 TABLET BY MOUTH 1 HOUR BEFORE SEXUAL ACTIVITY    . carvedilol (COREG) 25 MG  tablet Take 1 tablet (25 mg total) by mouth 2 (two) times daily. 180 tablet 3   No current facility-administered medications for this visit.     Allergies  Allergen Reactions  . Ace Inhibitors     Past Medical History:  Diagnosis Date  . CAD (coronary artery disease)   . HLD (hyperlipidemia)   . Hypertension   . Left ventricular dysfunction   . MI (myocardial infarction) (HCC)   . Right bundle branch block     Blood pressure 126/88, pulse 60.  Cardiomyopathy, ischemic, EF by Echo 03/24/2012 35-40% Will increase carvedilol to 25 mg bid.  Patient will continue with home BP and HR monitoring.  Will see him back in 3 weeks to determine if he is tolerating this dose well.    HTN (hypertension) For now will see how much drop in home BP will get from increase in carvedilol.  Will also have him switch the amlodipine to evenings.  He  will continue with daily blood pressure checks and bring his meter into the office at his next appointment in 3 weeks    HLD (hyperlipidemia) Patient is very eager to decrease the number of medications he takes, so based on his current LDL (52 this week at PCP), will let him cut atorvastatin from 40 mg to 20 mg daily.  He has recently changed his diet significantly to cut out meats, shellfishes and other gout-causing foods.  He is hoping that the improved diet will help keep his cholesterol down.  He is also going to cut his fish oil to one daily.  Will repeat lipid labs in 3-4 months to see if he needs the higher does of atorvastatin.   Phillips Hay PharmD CPP [facility]

## 2017-01-09 NOTE — Patient Instructions (Signed)
Return for a a follow up appointment in 3 weeks  Your blood pressure today is 156/88 (goal is < 130/80)  Check your blood pressure at home daily and keep record of the readings.  Take your BP meds as follows:  Increase carvedilol to 25 mg twice daily (take 2 of the 12.5 mg tabs twice daily until they are gone)  Cut furosemide dose to 20 mg daily.  If you notice a 3 pound weight gain/day or 5 pounds per week, please go back to 40 mg daily.  Also increase dose if you notice swelling in your ankles/feet/legs or have more shortness of breath  Continue with losartan each morning  Move amlodipine dose to each evening  Taper off the famotidine (Pepcid) by taking every other day for about 2 weeks, then twice weekly for 2 weeks, then stop  Cut fish oil back to 1 capsule daily  Cut atorvastatin to 20 mg daily, we will repeat cholesterol labs in 4-6 months  Bring all of your meds, your BP cuff and your record of home blood pressures to your next appointment.  Exercise as you're able, try to walk approximately 30 minutes per day.  Keep salt intake to a minimum, especially watch canned and prepared boxed foods.  Eat more fresh fruits and vegetables and fewer canned items.  Avoid eating in fast food restaurants.    HOW TO TAKE YOUR BLOOD PRESSURE: . Rest 5 minutes before taking your blood pressure. .  Don't smoke or drink caffeinated beverages for at least 30 minutes before. . Take your blood pressure before (not after) you eat. . Sit comfortably with your back supported and both feet on the floor (don't cross your legs). . Elevate your arm to heart level on a table or a desk. . Use the proper sized cuff. It should fit smoothly and snugly around your bare upper arm. There should be enough room to slip a fingertip under the cuff. The bottom edge of the cuff should be 1 inch above the crease of the elbow. . Ideally, take 3 measurements at one sitting and record the average.

## 2017-01-09 NOTE — Assessment & Plan Note (Signed)
Patient is very eager to decrease the number of medications he takes, so based on his current LDL (52 this week at PCP), will let him cut atorvastatin from 40 mg to 20 mg daily.  He has recently changed his diet significantly to cut out meats, shellfishes and other gout-causing foods.  He is hoping that the improved diet will help keep his cholesterol down.  He is also going to cut his fish oil to one daily.  Will repeat lipid labs in 3-4 months to see if he needs the higher does of atorvastatin.

## 2017-01-09 NOTE — Assessment & Plan Note (Signed)
Will increase carvedilol to 25 mg bid.  Patient will continue with home BP and HR monitoring.  Will see him back in 3 weeks to determine if he is tolerating this dose well.

## 2017-01-29 NOTE — Progress Notes (Signed)
Patient ID: Daniel Hurst                 DOB: 1951-01-27                      MRN: [id number]     HPI: Daniel Hurst is a 66 y.o. male referred by Dr. Allyson SabalBerry to HTN clinic.  PMH includes MI in 2012, ischemic cardiomyopathy, hypertension and hyperlipidemia.  His most recent EF is down to 35-40% after going as high as 45-50% in 2014. Patient presents for hypertension follow up and beta-blocker titration. During his most recent HTN visit carvedilol was increased to 25mg  twice daily, amlodipine dose was changed to evening administration, and furosemide daily dose was decreased to 20mg  daily with an additional 20mg  as needed for fluid retention.  Patient presents today for HTN and lipid follow up.  Denies increased fatigue, dizziness, swelling or any other adverse drug reaction. He was unable to stop taking famotidine, but all other recommended changes were accomplished.  Current HTN meds:   Amlodipine 5 mg every evening  Furosemide 20mg  daily (addiotnal 20mg  PRN)             Losartan 100 mg every morning             Hydralazine 25 mg twice daily             Carvedilol 25 mg twice daily  Current lipid management:  atorvastatin 20mg  daily   Fish-oil 1 capsule daily  Previously tried:   ACEI - cough  BP goal: < 130/80  Family History:   Father - MI, stroke died at 265             Mother - DM died at 2586             1 sister died with cancer,  Several other siblings with hypertension  Social History: Former smoker, quit in 1983; no alcohol, coffee 2 cups, no caffeinated sodas  Diet: Mostly home cooked, no added salt, occasional fried foods  Exercise: [employer] - eliptical, treadmill, walk, 3x week  Home BP readings: 26 readings: average 131/78; pulse 57-71 Home device (arm cuff) was calibrated today and accurate when appropriate technique used. Home device (wrist cuff) was calibrated today and consider accurate within 10mmHg  Wt Readings from Last 3 Encounters:  12/04/16 177 lb 9.6 oz (80.6 kg)    10/23/16 176 lb (79.8 kg)  10/11/15 175 lb (79.4 kg)   BP Readings from Last 3 Encounters:  01/30/17 138/74  01/09/17 126/88  12/04/16 (!) 151/84   Pulse Readings from Last 3 Encounters:  01/30/17 60  01/09/17 60  12/04/16 66    Past Medical History:  Diagnosis Date  . CAD (coronary artery disease)   . HLD (hyperlipidemia)   . Hypertension   . Left ventricular dysfunction   . MI (myocardial infarction) (HCC)   . Right bundle branch block     Current Outpatient Prescriptions on File Prior to Visit  Medication Sig Dispense Refill  . allopurinol (ZYLOPRIM) 300 MG tablet Take 1 tablet by mouth daily.    Marland Kitchen. amLODipine (NORVASC) 5 MG tablet Take 1 tablet (5 mg total) by mouth daily. 90 tablet 3  . aspirin 325 MG tablet Take 325 mg by mouth daily.    Marland Kitchen. atorvastatin (LIPITOR) 40 MG tablet Take 1 tablet (40 mg total) by mouth daily at 6 PM. 90 tablet 3  . carvedilol (COREG) 25 MG tablet Take 1 tablet (  25 mg total) by mouth 2 (two) times daily. 180 tablet 3  . clopidogrel (PLAVIX) 75 MG tablet Take 1 tablet by mouth daily. 90 tablet 3  . colchicine 0.6 MG tablet Take 0.6 mg by mouth as needed.     . Famotidine (PEPCID AC PO) Take 1 tablet by mouth daily.    . fish oil-omega-3 fatty acids 1000 MG capsule Take 1 g by mouth daily.     . furosemide (LASIX) 40 MG tablet Take 1 tablet (40 mg total) by mouth daily. 90 tablet 3  . hydrALAZINE (APRESOLINE) 25 MG tablet Take 1 tablet (25 mg total) by mouth 2 (two) times daily. 60 tablet 11  . losartan (COZAAR) 100 MG tablet Take 1 tablet (100 mg total) by mouth daily. 90 tablet 3  . nitroGLYCERIN (NITROSTAT) 0.4 MG SL tablet Place 1 tablet (0.4 mg total) under the tongue every 5 (five) minutes as needed. 25 tablet 11  . RESTASIS 0.05 % ophthalmic emulsion     . sildenafil (VIAGRA) 50 MG tablet TAKE 1/2 TO 1 TABLET BY MOUTH 1 HOUR BEFORE SEXUAL ACTIVITY     No current facility-administered medications on file prior to visit.     Allergies   Allergen Reactions  . Ace Inhibitors     Blood pressure 138/74, pulse 60.  Essential hypertension & B-Blocker titration:  Blood pressure better controlled in office and at home. Patient provided 26 BP readings from home with an average of 131/74 (range 109-154/57-71) and pulse also appropriate at 57-71.  Carvedilol currently at appropriate dose of 25mg  twice daily. Will continue current therapy without changes and follow up as needed.   Hyperlipidemia: Continue atorvastatin 20mg  daily and fish-oil 1g daily. Keep low fat diet and lifestyle modifications. Need to repeat lipid panel in 3-4 months.  Lillien Petronio Rodriguez-Guzman PharmD, BCPS [facility] [address] [place] [address] 01/30/2017 9:01 AM

## 2017-01-30 ENCOUNTER — Ambulatory Visit (INDEPENDENT_AMBULATORY_CARE_PROVIDER_SITE_OTHER): Payer: 59 | Admitting: Pharmacist

## 2017-01-30 VITALS — BP 138/74 | HR 60

## 2017-01-30 DIAGNOSIS — I10 Essential (primary) hypertension: Secondary | ICD-10-CM | POA: Diagnosis not present

## 2017-01-30 DIAGNOSIS — I255 Ischemic cardiomyopathy: Secondary | ICD-10-CM

## 2017-01-30 NOTE — Patient Instructions (Addendum)
Return for a  follow up appointment as needed  Your blood pressure today is 138/74 prior to medication administration (<130/80); pulse 60bpm  Check your blood pressure at home daily (if able) and keep record of the readings.  Take your BP meds as follows: *Continue all medication as prescribed**  Bring all of your meds, your BP cuff and your record of home blood pressures to your next appointment.  Exercise as you're able, try to walk approximately 30 minutes per day.  Keep salt intake to a minimum, especially watch canned and prepared boxed foods.  Eat more fresh fruits and vegetables and fewer canned items.  Avoid eating in fast food restaurants.    HOW TO TAKE YOUR BLOOD PRESSURE: . Rest 5 minutes before taking your blood pressure. .  Don't smoke or drink caffeinated beverages for at least 30 minutes before. . Take your blood pressure before (not after) you eat. . Sit comfortably with your back supported and both feet on the floor (don't cross your legs). . Elevate your arm to heart level on a table or a desk. . Use the proper sized cuff. It should fit smoothly and snugly around your bare upper arm. There should be enough room to slip a fingertip under the cuff. The bottom edge of the cuff should be 1 inch above the crease of the elbow. . Ideally, take 3 measurements at one sitting and record the average.

## 2017-02-18 ENCOUNTER — Telehealth: Payer: Self-pay | Admitting: Cardiovascular Disease

## 2017-02-18 NOTE — Telephone Encounter (Signed)
I had been given prior recommendations from Dr. Hazle CocaBerry's CMA for PCP referrals. Gave patient recommendations for preferred PCPs (Dr. Thea SilversmithMacKenzie or Dr. Duanne Guessewey) and their contact numbers. Patient aware to call if further needs or if unable to get in with one of these practices.

## 2017-02-18 NOTE — Telephone Encounter (Signed)
New message    Pt is calling about a referral for a primary doctor. Which one he should go to.

## 2017-08-16 ENCOUNTER — Other Ambulatory Visit: Payer: Self-pay | Admitting: Cardiovascular Disease

## 2017-08-19 ENCOUNTER — Other Ambulatory Visit: Payer: Self-pay | Admitting: Cardiovascular Disease

## 2017-08-19 NOTE — Telephone Encounter (Signed)
Rx(s) sent to pharmacy electronically.  

## 2017-08-20 ENCOUNTER — Telehealth: Payer: Self-pay | Admitting: Cardiovascular Disease

## 2017-08-20 NOTE — Telephone Encounter (Signed)
Closed Encounter  °

## 2017-08-22 ENCOUNTER — Telehealth: Payer: Self-pay | Admitting: Cardiovascular Disease

## 2017-08-22 NOTE — Telephone Encounter (Signed)
Pt wants to know when does he needs his next appt?It is not in the Recalls.

## 2017-08-22 NOTE — Telephone Encounter (Signed)
PerBerry note dated 12-04-2016:Follow-Up: 6 months with Dr. Allyson SabalBerry, so pt would be overdue for follow up per note.  Tried to call back and phone says pt does not have voice mail set up, will try to call back later

## 2017-08-23 NOTE — Telephone Encounter (Signed)
Unable to reach pt or leave a message  

## 2017-08-29 NOTE — Telephone Encounter (Signed)
Patient last seen 12/2016 - was to f/up with MD in 6 months (should have been Nov 2018)  Will need appointment Routed to scheduling pool

## 2017-08-30 NOTE — Telephone Encounter (Signed)
Closed Encounter  °

## 2017-09-11 ENCOUNTER — Ambulatory Visit: Payer: 59 | Admitting: Cardiovascular Disease

## 2017-09-11 ENCOUNTER — Encounter: Payer: Self-pay | Admitting: Cardiovascular Disease

## 2017-09-11 VITALS — BP 144/88 | HR 64 | Ht 66.0 in | Wt 175.4 lb

## 2017-09-11 DIAGNOSIS — I255 Ischemic cardiomyopathy: Secondary | ICD-10-CM | POA: Diagnosis not present

## 2017-09-11 DIAGNOSIS — E7800 Pure hypercholesterolemia, unspecified: Secondary | ICD-10-CM

## 2017-09-11 DIAGNOSIS — I519 Heart disease, unspecified: Secondary | ICD-10-CM

## 2017-09-11 DIAGNOSIS — I351 Nonrheumatic aortic (valve) insufficiency: Secondary | ICD-10-CM | POA: Insufficient documentation

## 2017-09-11 NOTE — Patient Instructions (Addendum)
Medication Instructions: Your physician recommends that you continue on your current medications as directed. Please refer to the Current Medication list given to you today.   Testing/Procedures: Your physician has requested that you have an echocardiogram in April. Echocardiography is a painless test that uses sound waves to create images of your heart. It provides your doctor with information about the size and shape of your heart and how well your heart's chambers and valves are working. This procedure takes approximately one hour. There are no restrictions for this procedure.  Follow-Up: Your physician recommends that you schedule a follow-up appointment in: 2 weeks with PharmD---transition from Losartan to Entresto.  We request that you follow-up in: 6 months with an extender and in 12 months with Dr San MorelleBerry  You will receive a reminder letter in the mail two months in advance. If you don't receive a letter, please call our office to schedule the follow-up appointment.  If you need a refill on your cardiac medications before your next appointment, please call your pharmacy.

## 2017-09-11 NOTE — Assessment & Plan Note (Signed)
History of hyperlipidemia on statin therapy and Vascepa for hypertriglyceridemia followed by his PCP

## 2017-09-11 NOTE — Assessment & Plan Note (Signed)
Stable CAD with occlusion of the LAD and RCA, severe LV dysfunction without angina.

## 2017-09-11 NOTE — Assessment & Plan Note (Signed)
History of ischemic cardiomyopathy EF 35% range. He did have a myocardial infarction June 2012 and underwent cardiac catheterization by Dr. Bea GraffSteven Kirtland 01/30/11 revealing an occluded RCA, occluded LAD and patent circumflex EF 25%. He said by the stress test performed as shown scar without ischemia. Last Myoview was performed 03/19/13 showed LV dysfunction with scar in the LAD and RCA territories. He denies chest pain or shortness of breath. I am going to transition him from losartan Sherryll BurgerEntresto

## 2017-09-11 NOTE — Progress Notes (Signed)
09/11/2017 Daniel Hurst   [birth year]  [id number]  Primary Physician Pearson Grippe, MD Primary Cardiologist: [name] MD Nicholes Cal[place], [place]  HPI:  Daniel Hurst is a 67 y.o.  thin-appearing married [place] male father of 2 who works as an Art gallery manager at [employer]. I last saw him  10/23/16. His risk factors include hypertension, hyperlipidemia, and family history. He did have a myocardial infarction in June 2012 and underwent cardiac catheterization by Dr. [name] on January 30, 2011, revealing an occluded RCA, an occluded LAD, and a patent circumflex and EF of 25%. He had a Myoview performed April 17, 2011, that showed scar in multiple territories. Echo performed in our office March 14, 2012, showed an EF of 35% to 45% with severe inferior wall hypokinesia as well as posterior wall hypokinesia. He had a severely dilated left atrium with a suspected patent foramen ovale. His other problems include hypertension, hyperlipidemia, and family history. He denies chest pain but does have significant dyspnea on exertion. Lipid profile performed March 25, 2012, revealed a total cholesterol of 94, LDL of 40, and HDL of 33.  Since I saw him back in the office 09/17/12 he has done well. He denies chest pain and has mild dyspnea on exertion but this has not Lescol limiting. This exercise frequently. A recent Myoview stress test performed last month showed scar in the LAD and RCA territories with without ischemia. The ejection fraction by quantitative gated SPECT was 33%.  After titrating his beta blocker his ejection fraction by 2-D echo performed 07/29/13 at increased up to the 45-50% range however his most recent echo performed 11/06/16 revealed a mildly dilated LV with an EF of 35-40% and moderate aortic insufficiency.     Current Meds  Medication Sig  . allopurinol (ZYLOPRIM) 300 MG tablet Take 1 tablet by mouth daily.  Marland Kitchen amLODipine (NORVASC) 5 MG tablet TAKE 1 TABLET BY MOUTH  DAILY  .  aspirin 325 MG tablet Take 325 mg by mouth daily.  Marland Kitchen atorvastatin (LIPITOR) 40 MG tablet Take 1 tablet (40 mg total) by mouth daily at 6 PM.  . carvedilol (COREG) 25 MG tablet TAKE 1 TABLET BY MOUTH TWO  TIMES DAILY  . clopidogrel (PLAVIX) 75 MG tablet Take 1 tablet (75 mg total) by mouth daily.  . colchicine 0.6 MG tablet Take 0.6 mg by mouth as needed.   . Famotidine (PEPCID AC PO) Take 1 tablet by mouth daily.  . furosemide (LASIX) 40 MG tablet Take 1 tablet (40 mg total) by mouth daily.  . hydrALAZINE (APRESOLINE) 25 MG tablet Take 1 tablet (25 mg total) by mouth 2 (two) times daily.  Bess Harvest Ethyl (VASCEPA) 1 g CAPS Take 2 tablets by mouth 2 (two) times daily.  Marland Kitchen losartan (COZAAR) 100 MG tablet Take 1 tablet (100 mg total) by mouth daily.  . nitroGLYCERIN (NITROSTAT) 0.4 MG SL tablet Place 1 tablet (0.4 mg total) under the tongue every 5 (five) minutes as needed.  . RESTASIS 0.05 % ophthalmic emulsion   . sildenafil (VIAGRA) 50 MG tablet TAKE 1/2 TO 1 TABLET BY MOUTH 1 HOUR BEFORE SEXUAL ACTIVITY     Allergies  Allergen Reactions  . Ace Inhibitors     Social History   Socioeconomic History  . Marital status: Married    Spouse name: Not on file  . Number of children: Not on file  . Years of education: Not on file  . Highest education level: Not on file  Social Needs  . Financial resource strain: Not on file  . Food insecurity - worry: Not on file  . Food insecurity - inability: Not on file  . Transportation needs - medical: Not on file  . Transportation needs - non-medical: Not on file  Occupational History  . Not on file  Tobacco Use  . Smoking status: Former Smoker    Last attempt to quit: 05/12/1981    Years since quitting: 36.3  . Smokeless tobacco: Never Used  Substance and Sexual Activity  . Alcohol use: No  . Drug use: No  . Sexual activity: Not on file  Other Topics Concern  . Not on file  Social History Narrative  . Not on file     Review of  Systems: General: negative for chills, fever, night sweats or weight changes.  Cardiovascular: negative for chest pain, dyspnea on exertion, edema, orthopnea, palpitations, paroxysmal nocturnal dyspnea or shortness of breath Dermatological: negative for rash Respiratory: negative for cough or wheezing Urologic: negative for hematuria Abdominal: negative for nausea, vomiting, diarrhea, bright red blood per rectum, melena, or hematemesis Neurologic: negative for visual changes, syncope, or dizziness All other systems reviewed and are otherwise negative except as noted above.    Blood pressure (!) 144/88, pulse 64, height 5\' 6"  (1.676 m), weight 175 lb 6.4 oz (79.6 kg).  General appearance: alert and no distress Neck: no adenopathy, no carotid bruit, no JVD, supple, symmetrical, trachea midline and thyroid not enlarged, symmetric, no tenderness/mass/nodules Lungs: clear to auscultation bilaterally Heart: regular rate and rhythm, S1, S2 normal, no murmur, click, rub or gallop Extremities: extremities normal, atraumatic, no cyanosis or edema Pulses: 2+ and symmetric Skin: Skin color, texture, turgor normal. No rashes or lesions Neurologic: Alert and oriented X 3, normal strength and tone. Normal symmetric reflexes. Normal coordination and gait  EKG sinus rhythm at 64 with right bundle-branch block, and inferior Q waves as well as anterior Q waves. I personally reviewed this EKG.  ASSESSMENT AND PLAN:   Cardiomyopathy, ischemic, EF by Echo 03/24/2012 35-40% History of ischemic cardiomyopathy EF 35% range. He did have a myocardial infarction June 2012 and underwent cardiac catheterization by Dr. Bea GraffSteven Kirtland 01/30/11 revealing an occluded RCA, occluded LAD and patent circumflex EF 25%. He said by the stress test performed as shown scar without ischemia. Last Myoview was performed 03/19/13 showed LV dysfunction with scar in the LAD and RCA territories. He denies chest pain or shortness of breath.  I am going to transition him from losartan Entresto   CAD (coronary artery disease):  cardiac catheterization June 2012 because of MI revealed occluded RCA and occluded LAD patent circumflex. Stable CAD with occlusion of the LAD and RCA, severe LV dysfunction without angina.  HTN (hypertension) History of essential hypertension blood pressure measured 144/88. He is on amlodipine, carvedilol, hydralazine and losartan. I am going to transition his losartan to Entresto to better treat  his ischemic cardiomyopathy.  HLD (hyperlipidemia) History of hyperlipidemia on statin therapy and Vascepa for hypertriglyceridemia followed by his PCP  Moderate aortic insufficiency History of moderate aortic insufficiency by 2-D echo in April of last year with moderate LV dysfunction and mildly dilated LV. We will repeat his 2-D echo in April of this year.      Runell GessJonathan J. Berry MD FACP,FACC,FAHA, [facility] 09/11/2017 5:08 PM

## 2017-09-11 NOTE — Assessment & Plan Note (Signed)
History of moderate aortic insufficiency by 2-D echo in April of last year with moderate LV dysfunction and mildly dilated LV. We will repeat his 2-D echo in April of this year.

## 2017-09-11 NOTE — Assessment & Plan Note (Signed)
History of essential hypertension blood pressure measured 144/88. He is on amlodipine, carvedilol, hydralazine and losartan. I am going to transition his losartan to Entresto to better treat  his ischemic cardiomyopathy.

## 2017-09-26 ENCOUNTER — Ambulatory Visit (HOSPITAL_COMMUNITY): Payer: 59 | Attending: Cardiology

## 2017-09-26 ENCOUNTER — Ambulatory Visit (INDEPENDENT_AMBULATORY_CARE_PROVIDER_SITE_OTHER): Payer: 59 | Admitting: Pharmacist

## 2017-09-26 ENCOUNTER — Other Ambulatory Visit: Payer: Self-pay

## 2017-09-26 ENCOUNTER — Encounter: Payer: Self-pay | Admitting: Pharmacist

## 2017-09-26 VITALS — BP 140/80 | HR 60 | Wt 177.8 lb

## 2017-09-26 DIAGNOSIS — I252 Old myocardial infarction: Secondary | ICD-10-CM | POA: Insufficient documentation

## 2017-09-26 DIAGNOSIS — I119 Hypertensive heart disease without heart failure: Secondary | ICD-10-CM | POA: Diagnosis not present

## 2017-09-26 DIAGNOSIS — I351 Nonrheumatic aortic (valve) insufficiency: Secondary | ICD-10-CM | POA: Diagnosis not present

## 2017-09-26 DIAGNOSIS — I2510 Atherosclerotic heart disease of native coronary artery without angina pectoris: Secondary | ICD-10-CM | POA: Insufficient documentation

## 2017-09-26 DIAGNOSIS — I255 Ischemic cardiomyopathy: Secondary | ICD-10-CM | POA: Diagnosis not present

## 2017-09-26 DIAGNOSIS — I77810 Thoracic aortic ectasia: Secondary | ICD-10-CM | POA: Insufficient documentation

## 2017-09-26 DIAGNOSIS — I519 Heart disease, unspecified: Secondary | ICD-10-CM

## 2017-09-26 DIAGNOSIS — Z87891 Personal history of nicotine dependence: Secondary | ICD-10-CM | POA: Diagnosis not present

## 2017-09-26 DIAGNOSIS — E785 Hyperlipidemia, unspecified: Secondary | ICD-10-CM | POA: Insufficient documentation

## 2017-09-26 LAB — ECHOCARDIOGRAM COMPLETE
AOASC: 44 cm
AVLVOTPG: 2 mmHg
AVPHT: 550 ms
CHL CUP MV DEC (S): 254
E decel time: 254 msec
EERAT: 13.66
FS: 30 % (ref 28–44)
IVS/LV PW RATIO, ED: 1.11
LA diam end sys: 50 mm
LA vol A4C: 76 ml
LADIAMINDEX: 2.63 cm/m2
LASIZE: 50 mm
LAVOL: 72 mL
LAVOLIN: 37.9 mL/m2
LV e' LATERAL: 3.33 cm/s
LVEEAVG: 13.66
LVEEMED: 13.66
LVOT VTI: 18 cm
LVOT area: 3.46 cm2
LVOT diameter: 21 mm
LVOT peak vel: 76 cm/s
LVOTSV: 62 mL
MV pk A vel: 75.2 m/s
MVPKEVEL: 45.5 m/s
PW: 11.3 mm — AB (ref 0.6–1.1)
TDI e' lateral: 3.33
TDI e' medial: 3.68
Weight: 2844.8 oz

## 2017-09-26 MED ORDER — PERFLUTREN LIPID MICROSPHERE
1.0000 mL | INTRAVENOUS | Status: AC | PRN
  Administered 2017-09-26: 2 mL via INTRAVENOUS

## 2017-09-26 MED ORDER — SACUBITRIL-VALSARTAN 49-51 MG PO TABS: 1.0000 | ORAL_TABLET | Freq: Two times a day (BID) | ORAL | 0 refills | Status: DC

## 2017-09-26 NOTE — Assessment & Plan Note (Signed)
Patient is appropriate for transition from losartan to Entresto to improve his HF management. Baseline BMET form Dec/2018 was obtained from PCP. Weight and BP were documented today and appropriate to initiate Entresto at 49/51 mg twice daily (1st dose today 2/21 in AM). Patient is to monitor BP twice daily and bring records to f/u in 2 weeks. BMET repeat and weight will be obtained during next follow up to determine if appropriate to increase Entresto to 97/103mg  twice daily.

## 2017-09-26 NOTE — Progress Notes (Signed)
Patient ID: [name]                 DOB: [birth year]                      MRN: [id number]     HPI: Daniel Hurst is a 67 y.o. male referred by Dr. Allyson Sabal to HTN clinic. PMH includes MI in 2012, ischemic cardiomyopathy, hypertension, and hyperlipidemia.  His most recent EF is down to 35-40% after going as high as 45-50% in 2014. Patient presents to pharmacist clinic for [facility] initation. He is currently on maximum carvedilol dose  and furosemide. Patient denies increased fatigue, dizziness, swelling or any other adverse drug reaction. Baseline Scr was 1.6 on December/2018.   Currentm HF meds:  Carvedilol 25mg  twice daily Furosemide 40mg  daily Losartan 100mg  daily  Intolerance::              ACEI - cough  BP goal:130/80  Family History:              Father - MI, stroke died at 69 Mother - DM died at 68 1 sister died with cancer, Several other siblingswith hypertension  Social History: Formersmoker, quit in 1983; no alcohol, coffee 2 cups, no caffeinated sodas  Diet: Mostly home cooked, no added salt, occasional fried foods  Exercise: [employer], treadmill, walk, 3x week  Home BP readings: none available today  Wt Readings from Last 3 Encounters:  09/26/17 177 lb 12.8 oz (80.6 kg)  09/11/17 175 lb 6.4 oz (79.6 kg)  12/04/16 177 lb 9.6 oz (80.6 kg)   BP Readings from Last 3 Encounters:  09/26/17 140/80  09/11/17 (!) 144/88  01/30/17 138/74   Pulse Readings from Last 3 Encounters:  09/26/17 60  09/11/17 64  01/30/17 60    Past Medical History:  Diagnosis Date  . CAD (coronary artery disease)   . HLD (hyperlipidemia)   . Hypertension   . Left ventricular dysfunction   . MI (myocardial infarction) (HCC)   . Right bundle branch block     Current Outpatient Medications on File Prior to Visit  Medication Sig Dispense Refill  . allopurinol (ZYLOPRIM) 300 MG tablet Take 1 tablet by mouth daily.    Marland Kitchen amLODipine (NORVASC) 5 MG tablet  TAKE 1 TABLET BY MOUTH  DAILY 90 tablet 3  . aspirin 325 MG tablet Take 325 mg by mouth daily.    Marland Kitchen atorvastatin (LIPITOR) 40 MG tablet Take 1 tablet (40 mg total) by mouth daily at 6 PM. 90 tablet 1  . carvedilol (COREG) 25 MG tablet TAKE 1 TABLET BY MOUTH TWO  TIMES DAILY 180 tablet 3  . clopidogrel (PLAVIX) 75 MG tablet Take 1 tablet (75 mg total) by mouth daily. 90 tablet 1  . colchicine 0.6 MG tablet Take 0.6 mg by mouth as needed.     . Famotidine (PEPCID AC PO) Take 1 tablet by mouth daily.    . furosemide (LASIX) 40 MG tablet Take 1 tablet (40 mg total) by mouth daily. 90 tablet 1  . hydrALAZINE (APRESOLINE) 25 MG tablet Take 1 tablet (25 mg total) by mouth 2 (two) times daily. 180 tablet 1  . Icosapent Ethyl (VASCEPA) 1 g CAPS Take 2 tablets by mouth 2 (two) times daily.    Marland Kitchen losartan (COZAAR) 100 MG tablet Take 1 tablet (100 mg total) by mouth daily. 90 tablet 1  . nitroGLYCERIN (NITROSTAT) 0.4 MG SL tablet Place 1 tablet (0.4 mg total)  under the tongue every 5 (five) minutes as needed. 25 tablet 11  . Prenatal Vit-Fe Fumarate-FA (M-VIT) tablet Take 1 tablet by mouth daily.    . RESTASIS 0.05 % ophthalmic emulsion     . sildenafil (VIAGRA) 50 MG tablet TAKE 1/2 TO 1 TABLET BY MOUTH 1 HOUR BEFORE SEXUAL ACTIVITY     No current facility-administered medications on file prior to visit.     Allergies  Allergen Reactions  . Ace Inhibitors     Blood pressure 140/80, pulse 60, weight 177 lb 12.8 oz (80.6 kg), SpO2 97 %.  Cardiomyopathy, ischemic, EF by Echo 03/24/2012 35-40% Patient is appropriate for transition from losartan to Entresto to improve his HF management. Baseline BMET form Dec/2018 was obtained from PCP. Weight and BP were documented today and appropriate to initiate Entresto at 49/51 mg twice daily (1st dose today 2/21 in AM). Patient is to monitor BP twice daily and bring records to f/u in 2 weeks. BMET repeat and weight will be obtained during next follow up to determine  if appropriate to increase Entresto to 97/103mg  twice daily.    Siyah Mault Rodriguez-Guzman PharmD, BCPS, CPP [facility] [address] [place] [id number] 09/26/2017 9:08 PM

## 2017-09-26 NOTE — Patient Instructions (Addendum)
Return for a follow up appointment in 2 weeks  Check your blood pressure at home daily (if able) and keep record of the readings.  Take your BP meds as follows:  STOP taking losartan 100mg  START taking ENTRESTO 49mg /51mg  twice daily  Bring all of your meds, your BP cuff and your record of home blood pressures to your next appointment.  Exercise as you're able, try to walk approximately 30 minutes per day.  Keep salt intake to a minimum, especially watch canned and prepared boxed foods.  Eat more fresh fruits and vegetables and fewer canned items.  Avoid eating in fast food restaurants.    HOW TO TAKE YOUR BLOOD PRESSURE: . Rest 5 minutes before taking your blood pressure. .  Don't smoke or drink caffeinated beverages for at least 30 minutes before. . Take your blood pressure before (not after) you eat. . Sit comfortably with your back supported and both feet on the floor (don't cross your legs). . Elevate your arm to heart level on a table or a desk. . Use the proper sized cuff. It should fit smoothly and snugly around your bare upper arm. There should be enough room to slip a fingertip under the cuff. The bottom edge of the cuff should be 1 inch above the crease of the elbow. . Ideally, take 3 measurements at one sitting and record the average.

## 2017-10-01 ENCOUNTER — Telehealth: Payer: Self-pay | Admitting: Cardiovascular Disease

## 2017-10-01 NOTE — Telephone Encounter (Signed)
Called patient and LVM to call back to make a Dr. Allyson SabalBerry followup.

## 2017-10-02 ENCOUNTER — Telehealth: Payer: Self-pay | Admitting: Cardiovascular Disease

## 2017-10-02 NOTE — Telephone Encounter (Signed)
Called patient and LVM to call back to schedule followup with Dr. Berry. °

## 2017-10-08 NOTE — Progress Notes (Signed)
Cardiology Office Note   Date:  10/09/2017   ID:  Daniel Hurst, DOB 1951/07/20, MRN [id number]  PCP:  Daniel Grippe, MD  Cardiologist:  Dr. Allyson Sabal  Chief Complaint  Patient presents with  . Follow-up     History of Present Illness: Daniel Hurst is a 67 y.o. male who presents for ongoing assessment and management of hypertension, hyperlipidemia, history of prior MI in June 2012. Cardiac catheterization completed on 01/30/2011 revealedan occluded RCA, an occluded LAD, and a patent circumflex and EF of 25%. He had a Myoview performed April 17, 2011, that showed scar in multiple territories.  Echo performed in our office March 14, 2012, showed an EF of 35% to 45% with severe inferior wall hypokinesia as well as posterior wall hypokinesia. He had a severely dilated left atrium with a suspected patent foramen ovale. His other problems include hypertension, hyperlipidemia, and family history. He denies chest pain but does have significant dyspnea on exertion.  He last saw Dr. Allyson Sabal on 09/10/2017, which time Dr. Allyson Sabal had been titrating his beta blocker. 's recent echo revealed ischemic cardiomyopathy, EF 25%. Dr. Allyson Sabal began him on Entresto and discontinued him all of losartan. The patient was hypertensive on office visit at 144/80. He was continued on amlodipine, carvedilol, hydralazine and as stated started on Entresto. He will need a repeat echocardiogram in a couple months to evaluate LV function along with aortic valve due to aortic insufficiency.  Patient is here today without any complaints. He has tolerated the Entresto 49/51 mg without complaints of dizziness near syncope or fatigue. He has maintained his weight. He is avoiding salty foods.  Echocardiogram completed on 09/26/2017 Left ventricle: The cavity size was normal. Wall thickness was   normal. Basal inferior akinesis, apical inferior akinesis,   akinesis of the true apex. Mid to apical anteroseptal akinesis.   Inferolateral and  anterolateral hypokinesis. Systolic function   was moderately to severely reduced. The estimated ejection   fraction was in the range of 30% to 35%. Doppler parameters are   consistent with abnormal left ventricular relaxation (grade 1   diastolic dysfunction). - Aortic valve: There was no stenosis. There was mild   regurgitation. - Aorta: Mildly dilated aortic root and ascending aorta. Aortic   root dimension: 40 mm (ED). Ascending aortic diameter: 44 mm (S). - Mitral valve: Mildly calcified annulus. There was trivial   regurgitation. - Left atrium: The atrium was mildly dilated. - Right ventricle: The cavity size was normal. Systolic function   was normal. - Pulmonary arteries: No complete TR doppler jet so unable to   estimate PA systolic pressure. - Inferior vena cava: The vessel was normal in size. The   respirophasic diameter changes were in the normal range (>= 50%),   consistent with normal central venous pressure. Impressions: - Normal LV size with EF 30-35%. Wall motion abnormalities as noted   above. Normal RV size and systolic function. No significant   valvular abnormalities. Dilated ascending aorta.  Past Medical History:  Diagnosis Date  . CAD (coronary artery disease)   . HLD (hyperlipidemia)   . Hypertension   . Left ventricular dysfunction   . MI (myocardial infarction) (HCC)   . Right bundle branch block     Past Surgical History:  Procedure Laterality Date  . CARDIOVASCULAR STRESS TEST  03/19/13   stress nuclear study extensive inferior, inferoseptal and apical scar with little, if any, periinfarct ischemia..  . TRANSTHORACIC ECHOCARDIOGRAM  03/24/2012   ejection fraction 35-45%. Apical  akinesis. Severe inferior wall hypokinesis. Severe posterior wall hypokinesis. Supected patent foramen ovale     Current Outpatient Medications  Medication Sig Dispense Refill  . allopurinol (ZYLOPRIM) 300 MG tablet Take 1 tablet by mouth daily.    Marland Kitchen amLODipine  (NORVASC) 5 MG tablet TAKE 1 TABLET BY MOUTH  DAILY 90 tablet 3  . aspirin 325 MG tablet Take 325 mg by mouth daily.    Marland Kitchen atorvastatin (LIPITOR) 40 MG tablet Take 1 tablet (40 mg total) by mouth daily at 6 PM. 90 tablet 1  . carvedilol (COREG) 25 MG tablet TAKE 1 TABLET BY MOUTH TWO  TIMES DAILY 180 tablet 3  . clopidogrel (PLAVIX) 75 MG tablet Take 1 tablet (75 mg total) by mouth daily. 90 tablet 1  . colchicine 0.6 MG tablet Take 0.6 mg by mouth as needed.     . Famotidine (PEPCID AC PO) Take 1 tablet by mouth daily.    . furosemide (LASIX) 40 MG tablet Take 1 tablet (40 mg total) by mouth daily. 90 tablet 1  . hydrALAZINE (APRESOLINE) 25 MG tablet Take 1 tablet (25 mg total) by mouth 2 (two) times daily. 180 tablet 1  . Icosapent Ethyl (VASCEPA) 1 g CAPS Take 2 tablets by mouth 2 (two) times daily.    . Multiple Vitamin (MULTIVITAMIN) tablet Take 1 tablet by mouth daily.    . nitroGLYCERIN (NITROSTAT) 0.4 MG SL tablet Place 1 tablet (0.4 mg total) under the tongue every 5 (five) minutes as needed. 25 tablet 11  . RESTASIS 0.05 % ophthalmic emulsion     . sacubitril-valsartan (ENTRESTO) 49-51 MG Take 1 tablet by mouth 2 (two) times daily. 28 tablet 0  . sildenafil (VIAGRA) 50 MG tablet TAKE 1/2 TO 1 TABLET BY MOUTH 1 HOUR BEFORE SEXUAL ACTIVITY     No current facility-administered medications for this visit.     Allergies:   Ace inhibitors    Social History:  The patient  reports that he quit smoking about 36 years ago. he has never used smokeless tobacco. He reports that he does not drink alcohol or use drugs.   Family History:  The patient's family history includes Diabetes in his mother; Heart attack in his father; Stroke in his father.    ROS: All other systems are reviewed and negative. Unless otherwise mentioned in H&P    PHYSICAL EXAM: VS:  BP 120/68   Pulse 70   Ht 5\' 6"  (1.676 m)   Wt 178 lb 3.2 oz (80.8 kg)   BMI 28.76 kg/m  , BMI Body mass index is 28.76 kg/m. GEN:  Well nourished, well developed, in no acute distress  HEENT: normal  Neck: no JVD, carotid bruits, or masses Cardiac: RRR; no murmurs, rubs, or gallops,no edema  Respiratory:  Clear to auscultation bilaterally, normal work of breathing GI: soft, nontender, nondistended, + BS MS: no deformity or atrophy  Skin: warm and dry, no rash Neuro:  Strength and sensation are intact Psych: euthymic mood, full affect   Recent Labs: No results found for requested labs within last 8760 hours.    Lipid Panel    Component Value Date/Time   CHOL 124 (L) 10/11/2015 0936   TRIG 149 10/11/2015 0936   HDL 32 (L) 10/11/2015 0936   CHOLHDL 3.9 10/11/2015 0936   VLDL 30 10/11/2015 0936   LDLCALC 62 10/11/2015 0936      Wt Readings from Last 3 Encounters:  10/09/17 178 lb 3.2 oz (80.8 kg)  09/26/17  177 lb 12.8 oz (80.6 kg)  09/11/17 175 lb 6.4 oz (79.6 kg)      Other studies Reviewed: Prior Echocardiogram 4//10/2016 Left ventricle: The cavity size was mildly dilated. There was   moderate concentric hypertrophy. Systolic function was moderately   reduced. The estimated ejection fraction was in the range of 35%   to 40%. Diffuse hypokinesis. Features are consistent with a   pseudonormal left ventricular filling pattern, with concomitant   abnormal relaxation and increased filling pressure (grade 2   diastolic dysfunction). Doppler parameters are consistent with   elevated ventricular end-diastolic filling pressure. - Ventricular septum: Septal motion showed paradox. - Aortic valve: There was moderate regurgitation. - Aortic root: The aortic root was mildly dilated measuring 42 mm. - Ascending aorta: The ascending aorta was mildly dilated measuring   43 mm. - Mitral valve: There was mild regurgitation. - Left atrium: The atrium was moderately dilated. - Right ventricle: Systolic function was normal. - Right atrium: The atrium was mildly dilated. - Tricuspid valve: There was mild  regurgitation. - Pulmonic valve: There was trivial regurgitation. - Pulmonary arteries: Systolic pressure was mildly increased. PA   peak pressure: 37 mm Hg (S). - Inferior vena cava: The vessel was normal in size. - Pericardium, extracardiac: There was no pericardial effusion.  Impressions:  - Since the last study on 07/27/2013 the left ventricle is now   mildly dilated, LVEF has decreased from 45-50% to 35-40%.   Aortic regurgitation is now moderate. There is mild pulmonary   hypertension.  ASSESSMENT AND PLAN:  1.  Ischemic cardiomyopathy: Most recent echocardiogram revealed an EF of 30% to 35%, decreased from prior echo revealing EF of 35% to 40%. He has recently been started on Entresto 49/51 mg twice a day. He brings with him a copy of his blood pressures which have been running in the 120s over 60s systolic.  He is also been seen by our pharmacy, [facility] Handingodriguez, Ph D/ today.  The patient is reluctant to go up on his dose of Entresto as he also sees an ophthalmologist who is worried about cerebral blood flow and optic nerve perfusion. The patient does have issues with his eyes and there is concern that his blood pressure will be to go causing worsening vision.  Will order a BMET today to evaluate kidney function. I have given him a new prescription for Entresto with a 30 day supply card, and some samples as he is running out of his samples that were provided on last office visit Dr. Allyson SabalBerry and pharmacy.  I am going to refer him to the Advanced Heart Failure clinic for ongoing evaluation and management of ischemic cardiopathy with reduced EF. He will, of course, continued to be seen by Dr. Allyson SabalBerry as well. He will continue carvedilol 25 mg twice a day, current dose of Entresto, and Lasix.  2. Coronary artery disease: Cardiac catheterization on 01/30/2011 revealed occluded RCA, occluded LAD, patent circumflex,. Most recent Myoview 04/17/2011 showed scar in multiple territories.  Continue aspirin, Plavix.   3. Hypertension: Blood pressure is not completely optimal for reduced EF. The patient is currently on amlodipine 5 mg at at bedtime along with hydralazine 25 mg 3 times a day, in addition to [place]. Recommendations for medication titration and elimination of amlodipine will be deferred to CHF clinic so that titration of Entresto can be considered.  4. Hypercholesterolemia: Continue atorvastatin as directed. Will need follow-up fasting lipids and LFTs on next visit   Current medicines are  reviewed at length with the patient today.    Labs/ tests ordered today include: BMET  Bettey Mare. Liborio Nixon, ANP, AACC   10/09/2017 8:18 AM    [facility] [address], [place], [place] [address] Phone: [phone]; Fax: [phone]

## 2017-10-09 ENCOUNTER — Encounter: Payer: Self-pay | Admitting: Adult Health

## 2017-10-09 ENCOUNTER — Telehealth (HOSPITAL_COMMUNITY): Payer: Self-pay | Admitting: Cardiology

## 2017-10-09 ENCOUNTER — Ambulatory Visit: Payer: 59 | Admitting: Adult Health

## 2017-10-09 ENCOUNTER — Ambulatory Visit (INDEPENDENT_AMBULATORY_CARE_PROVIDER_SITE_OTHER): Payer: 59 | Admitting: Pharmacist

## 2017-10-09 ENCOUNTER — Telehealth: Payer: Self-pay | Admitting: Adult Health

## 2017-10-09 VITALS — BP 120/68 | HR 70 | Ht 66.0 in | Wt 178.2 lb

## 2017-10-09 DIAGNOSIS — I255 Ischemic cardiomyopathy: Secondary | ICD-10-CM

## 2017-10-09 DIAGNOSIS — E7800 Pure hypercholesterolemia, unspecified: Secondary | ICD-10-CM | POA: Diagnosis not present

## 2017-10-09 DIAGNOSIS — Z79899 Other long term (current) drug therapy: Secondary | ICD-10-CM

## 2017-10-09 DIAGNOSIS — I10 Essential (primary) hypertension: Secondary | ICD-10-CM

## 2017-10-09 DIAGNOSIS — I2510 Atherosclerotic heart disease of native coronary artery without angina pectoris: Secondary | ICD-10-CM | POA: Diagnosis not present

## 2017-10-09 LAB — BASIC METABOLIC PANEL
BUN/Creatinine Ratio: 15 (ref 10–24)
BUN: 26 mg/dL (ref 8–27)
CALCIUM: 9.1 mg/dL (ref 8.6–10.2)
CO2: 23 mmol/L (ref 20–29)
Chloride: 103 mmol/L (ref 96–106)
Creatinine, Ser: 1.72 mg/dL — ABNORMAL HIGH (ref 0.76–1.27)
GFR calc Af Amer: 47 mL/min/{1.73_m2} — ABNORMAL LOW (ref >59)
GFR, EST NON AFRICAN AMERICAN: 41 mL/min/{1.73_m2} — AB (ref >59)
Glucose: 142 mg/dL — ABNORMAL HIGH (ref 65–99)
POTASSIUM: 4 mmol/L (ref 3.5–5.2)
SODIUM: 142 mmol/L (ref 134–144)

## 2017-10-09 MED ORDER — SACUBITRIL-VALSARTAN 49-51 MG PO TABS: 1.0000 | ORAL_TABLET | Freq: Two times a day (BID) | ORAL | 6 refills | Status: DC

## 2017-10-09 MED ORDER — SACUBITRIL-VALSARTAN 49-51 MG PO TABS: 1.0000 | ORAL_TABLET | Freq: Two times a day (BID) | ORAL | 3 refills | Status: DC

## 2017-10-09 NOTE — Patient Instructions (Signed)
Medication Instructions:  NO CHANGES-Your physician recommends that you continue on your current medications as directed. Please refer to the Current Medication list given to you today.  If you need a refill on your cardiac medications before your next appointment, please call your pharmacy.  Labwork: BMET TODAY HERE IN OUR OFFICE AT [facility]  Take the provided lab slips for you to take with you to the lab for you blood draw.   Special Instructions: REFER TO ADVANCED HEART FAILURE CLINIC-^EF, ISCHEMIC CARDIOMYOPATHY  Follow-Up: Your physician wants you to follow-up in: 3 MONTHS WITH DR Allyson SabalBERRY.  Thank you for choosing [facility] at [facility]!!

## 2017-10-09 NOTE — Telephone Encounter (Signed)
Follow Up:    Pt saw Joni ReiningKathryn Lawrence this morning,She was supposed to have called his Entresto. [phone].

## 2017-10-09 NOTE — Telephone Encounter (Signed)
Called and LVM for patient to call back.  Need to give patient New CHF appt with Dr. Shirlee LatchMcLean for 11/15/17.

## 2017-10-10 ENCOUNTER — Encounter: Payer: Self-pay | Admitting: Pharmacist

## 2017-10-10 NOTE — Assessment & Plan Note (Signed)
Patient blood pressure remains appropriate for current therapy. He is tolerating Entresto without problems. Complete assessment was done by NP today as well. Will repeat BMET today, send Entresto Rx to prefer pharmacy, and follow up as needed.

## 2017-10-10 NOTE — Progress Notes (Signed)
Patient ID: Daniel BumpLinh Shimmel                 DOB: 04-08-1951                      MRN: [id number]     HPI: Daniel Hurst is a 67 y.o. male referred by Dr. Allyson SabalBerry to HTN clinic. PMH includes MI in 2012, ischemic cardiomyopathy, hypertension, and hyperlipidemia.  His most recent EF is down to 35-40% after going as high as 45-50% in 2014. Patient presents to pharmacist clinic for [facility] tit[facility]. He is currently on maximum carvedilol dose, furosemide, and Entresto 49/51mg . Patient denies increased fatigue, dizziness, swelling or any other adverse drug reaction.   Currentm HF meds:  Carvedilol 25mg  twice daily Furosemide 40mg  daily Entresto 49mg /51mg  twice daily  Intolerance::              ACEI - cough  BP goal:130/80  Family History:              Father - MI, stroke died at 1365 Mother - DM died at 2286 1 sister died with cancer, Several other siblingswith hypertension  Social History: Formersmoker, quit in 1983; no alcohol, coffee 2 cups, no caffeinated sodas  Diet: Mostly home cooked, no added salt, occasional fried foods  Exercise: [employer], treadmill, walk, 3x week  Home BP readings: 9 reading; average 120/71 (pulse 60-73)  Wt Readings from Last 3 Encounters:  10/09/17 178 lb 3.2 oz (80.8 kg)  09/26/17 177 lb 12.8 oz (80.6 kg)  09/11/17 175 lb 6.4 oz (79.6 kg)   BP Readings from Last 3 Encounters:  10/09/17 120/68  09/26/17 140/80  09/11/17 (!) 144/88   Pulse Readings from Last 3 Encounters:  10/09/17 70  09/26/17 60  09/11/17 64    Past Medical History:  Diagnosis Date  . CAD (coronary artery disease)   . HLD (hyperlipidemia)   . Hypertension   . Left ventricular dysfunction   . MI (myocardial infarction) (HCC)   . Right bundle branch block     Current Outpatient Medications on File Prior to Visit  Medication Sig Dispense Refill  . allopurinol (ZYLOPRIM) 300 MG tablet Take 1 tablet by mouth daily.    Marland Kitchen. amLODipine (NORVASC) 5 MG  tablet TAKE 1 TABLET BY MOUTH  DAILY 90 tablet 3  . aspirin 325 MG tablet Take 325 mg by mouth daily.    Marland Kitchen. atorvastatin (LIPITOR) 40 MG tablet Take 1 tablet (40 mg total) by mouth daily at 6 PM. 90 tablet 1  . carvedilol (COREG) 25 MG tablet TAKE 1 TABLET BY MOUTH TWO  TIMES DAILY 180 tablet 3  . clopidogrel (PLAVIX) 75 MG tablet Take 1 tablet (75 mg total) by mouth daily. 90 tablet 1  . colchicine 0.6 MG tablet Take 0.6 mg by mouth as needed.     . Famotidine (PEPCID AC PO) Take 1 tablet by mouth daily.    . furosemide (LASIX) 40 MG tablet Take 1 tablet (40 mg total) by mouth daily. 90 tablet 1  . hydrALAZINE (APRESOLINE) 25 MG tablet Take 1 tablet (25 mg total) by mouth 2 (two) times daily. 180 tablet 1  . Icosapent Ethyl (VASCEPA) 1 g CAPS Take 2 tablets by mouth 2 (two) times daily.    . nitroGLYCERIN (NITROSTAT) 0.4 MG SL tablet Place 1 tablet (0.4 mg total) under the tongue every 5 (five) minutes as needed. 25 tablet 11  . RESTASIS 0.05 % ophthalmic emulsion     .  sildenafil (VIAGRA) 50 MG tablet TAKE 1/2 TO 1 TABLET BY MOUTH 1 HOUR BEFORE SEXUAL ACTIVITY     No current facility-administered medications on file prior to visit.     Allergies  Allergen Reactions  . Ace Inhibitors     Cardiomyopathy, ischemic, EF by Echo 03/24/2012 35-40% Patient blood pressure remains appropriate for current therapy. He is tolerating Entresto without problems. Complete assessment was done by NP today as well. Will repeat BMET today, send Entresto Rx to prefer pharmacy, and follow up as needed.    Josilynn Losh Rodriguez-Guzman PharmD, BCPS, CPP [facility] [address] [place] [address] 10/10/2017 12:03 PM

## 2017-10-14 ENCOUNTER — Other Ambulatory Visit: Payer: Self-pay

## 2017-10-14 MED ORDER — SACUBITRIL-VALSARTAN 49-51 MG PO TABS: 1.0000 | ORAL_TABLET | Freq: Two times a day (BID) | ORAL | 3 refills | Status: DC

## 2017-10-16 ENCOUNTER — Telehealth: Payer: Self-pay | Admitting: Cardiovascular Disease

## 2017-10-16 MED ORDER — SACUBITRIL-VALSARTAN 49-51 MG PO TABS: 1.0000 | ORAL_TABLET | Freq: Two times a day (BID) | ORAL | 3 refills | Status: DC

## 2017-10-16 NOTE — Telephone Encounter (Signed)
New message   [facility] to [facility]    *STAT* If patient is at the pharmacy, call can be transferred to refill team.   1. Which medications need to be refilled? (please list name of each medication and dose if known)  entresto   2. Which pharmacy/location (including street and city if local pharmacy) is medication to be sent to? [facility] [address]   3. Do they need a 30 day or 90 day supply? 30

## 2017-10-30 ENCOUNTER — Ambulatory Visit: Payer: 59 | Admitting: Cardiovascular Disease

## 2017-11-15 ENCOUNTER — Encounter (HOSPITAL_COMMUNITY): Payer: Self-pay | Admitting: Cardiology

## 2017-11-15 ENCOUNTER — Ambulatory Visit (HOSPITAL_COMMUNITY)
Admission: RE | Admit: 2017-11-15 | Discharge: 2017-11-15 | Disposition: A | Discharge status: Home / Self Care | Payer: 59 | Source: Ambulatory Visit | Attending: Cardiology | Admitting: Cardiology

## 2017-11-15 VITALS — BP 121/78 | HR 66 | Ht 66.0 in | Wt 176.0 lb

## 2017-11-15 DIAGNOSIS — G4733 Obstructive sleep apnea (adult) (pediatric): Secondary | ICD-10-CM | POA: Insufficient documentation

## 2017-11-15 DIAGNOSIS — E785 Hyperlipidemia, unspecified: Secondary | ICD-10-CM | POA: Insufficient documentation

## 2017-11-15 DIAGNOSIS — I5022 Chronic systolic (congestive) heart failure: Secondary | ICD-10-CM | POA: Insufficient documentation

## 2017-11-15 DIAGNOSIS — N183 Chronic kidney disease, stage 3 (moderate): Secondary | ICD-10-CM | POA: Insufficient documentation

## 2017-11-15 DIAGNOSIS — I2510 Atherosclerotic heart disease of native coronary artery without angina pectoris: Secondary | ICD-10-CM | POA: Insufficient documentation

## 2017-11-15 DIAGNOSIS — I130 Hypertensive heart and chronic kidney disease with heart failure and stage 1 through stage 4 chronic kidney disease, or unspecified chronic kidney disease: Secondary | ICD-10-CM | POA: Insufficient documentation

## 2017-11-15 DIAGNOSIS — Z79899 Other long term (current) drug therapy: Secondary | ICD-10-CM | POA: Diagnosis not present

## 2017-11-15 DIAGNOSIS — I351 Nonrheumatic aortic (valve) insufficiency: Secondary | ICD-10-CM | POA: Diagnosis not present

## 2017-11-15 DIAGNOSIS — I255 Ischemic cardiomyopathy: Secondary | ICD-10-CM | POA: Diagnosis not present

## 2017-11-15 LAB — BASIC METABOLIC PANEL
ANION GAP: 11 (ref 5–15)
BUN: 21 mg/dL — AB (ref 6–20)
CALCIUM: 9 mg/dL (ref 8.9–10.3)
CO2: 22 mmol/L (ref 22–32)
Chloride: 105 mmol/L (ref 101–111)
Creatinine, Ser: 1.57 mg/dL — ABNORMAL HIGH (ref 0.61–1.24)
GFR calc Af Amer: 52 mL/min — ABNORMAL LOW (ref >60)
GFR, EST NON AFRICAN AMERICAN: 45 mL/min — AB (ref >60)
GLUCOSE: 145 mg/dL — AB (ref 65–99)
POTASSIUM: 3.8 mmol/L (ref 3.5–5.1)
SODIUM: 138 mmol/L (ref 135–145)

## 2017-11-15 LAB — LIPID PANEL
Cholesterol: 122 mg/dL (ref 0–200)
HDL: 29 mg/dL — ABNORMAL LOW (ref >40)
LDL CALC: 59 mg/dL (ref 0–99)
TRIGLYCERIDES: 170 mg/dL — AB (ref <150)
Total CHOL/HDL Ratio: 4.2 RATIO
VLDL: 34 mg/dL (ref 0–40)

## 2017-11-15 MED ORDER — ASPIRIN 81 MG PO TABS: 81.0000 mg | ORAL_TABLET | Freq: Every day | ORAL | 3 refills | Status: AC

## 2017-11-15 MED ORDER — SPIRONOLACTONE 25 MG PO TABS: 12.5000 mg | ORAL_TABLET | Freq: Every day | ORAL | 3 refills | Status: DC

## 2017-11-15 NOTE — Patient Instructions (Signed)
Stop Hydralazine   Start Spironolactone 12.5 mg (1/2 tab) daily  Decrease Asprin 81 mg (1 tab) daily  Labs drawn today (if we do not call you, then your lab work was stable)   Your physician recommends that you return for lab work in: 10 days   Your physician has requested that you have an echocardiogram. Echocardiography is a painless test that uses sound waves to create images of your heart. It provides your doctor with information about the size and shape of your heart and how well your heart's chambers and valves are working. This procedure takes approximately one hour. There are no restrictions for this procedure. -In August 2019 (they will call you)    Your physician recommends that you schedule a follow-up appointment in: 3 weeks with Cicero DuckErika  Your physician recommends that you schedule a follow-up appointment in: 6 weeks with Dr. Shirlee LatchMcLean

## 2017-11-17 NOTE — Progress Notes (Signed)
PCP: Pearson GrippeJames Kim Cardiology: Dr. Allyson SabalBerry HF Cardiology: Dr. Shirlee LatchMcLean  67 yo with history of CAD and chronic systolic CHF/ischemic cardiomyopathy as well as CKD stage 3 was referred by Dr. Allyson SabalBerry for evaluation of CHF.  Patient had an MI in 6/12.  At that time, cath showed EF 25% by LV-gram with occluded LAD and RCA, no intervention.  Since then, EF has been low.  Most recent echo in 2/19 showed EF 30-35% with regional WMAs.  He has CKD stage 3, most recent creatinine was 1.72.    He has been stable symptomatically.  He is not short of breath walking on flat ground but does get short of breath walking up a flight of stairs.  He works out on a treadmill 3 times/week for 30 minutes.  He is able to push mow his yard.  No orthopnea/PND.  No chest pain.  No lightheadedness.   ECG (2/19, personally reviewed): NSR, RBBB, old inferior and anterior MIs  Labs (3/19): K 4, creatinine 1.72  PMH: 1. HTN 2. OSA: Uses CPAP 3. CKD: Stage 3, likely hypertensive nephropathy.  4. Hyperlipidemia 5. CAD: MI in 6/12.  LHC (6/12) with totally occluded RCA, totally occluded LAD, patent LCx.  No intervention.  6. Chronic systolic CHF: Ischemic cardiomyopathy.  - EF 25% by LV-gram in 2012.   - Echo (8/13) with EF 35-40%.   - Echo (2/19) with EF 30-35%, wall motion abnormalities, normal RV size and systolic function, mild AI.  7. Aortic insufficiency: Moderate on 4/18 echo.  Mild on 2/19 echo.   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 No ETOH or drugs.   Family History  Problem Relation Age of Onset  . Diabetes Mother   . Heart attack Father   . Stroke Father    ROS: All systems reviewed and negative except as per HPI.  Current Outpatient Medications  Medication Sig Dispense Refill  . allopurinol (ZYLOPRIM) 300 MG tablet Take 1 tablet by mouth daily.    Marland Kitchen. amLODipine (NORVASC) 5 MG tablet TAKE 1 TABLET BY MOUTH  DAILY 90 tablet 3  . aspirin 81 MG tablet Take 1 tablet (81 mg total)  by mouth daily. 30 tablet 3  . atorvastatin (LIPITOR) 40 MG tablet Take 1 tablet (40 mg total) by mouth daily at 6 PM. 90 tablet 1  . carvedilol (COREG) 25 MG tablet TAKE 1 TABLET BY MOUTH TWO  TIMES DAILY 180 tablet 3  . clopidogrel (PLAVIX) 75 MG tablet Take 1 tablet (75 mg total) by mouth daily. 90 tablet 1  . colchicine 0.6 MG tablet Take 0.6 mg by mouth as needed.     . Famotidine (PEPCID AC PO) Take 1 tablet by mouth daily.    . furosemide (LASIX) 40 MG tablet Take 1 tablet (40 mg total) by mouth daily. 90 tablet 1  . Icosapent Ethyl (VASCEPA) 1 g CAPS Take 2 tablets by mouth 2 (two) times daily.    . Multiple Vitamin (MULTIVITAMIN) tablet Take 1 tablet by mouth daily.    . nitroGLYCERIN (NITROSTAT) 0.4 MG SL tablet Place 1 tablet (0.4 mg total) under the tongue every 5 (five) minutes as needed. 25 tablet 11  . RESTASIS 0.05 % ophthalmic emulsion     . sacubitril-valsartan (ENTRESTO) 49-51 MG Take 1 tablet by mouth 2 (two) times daily. 60 tablet 3  . sildenafil (VIAGRA) 50 MG tablet TAKE 1/2 TO 1 TABLET BY MOUTH 1 HOUR BEFORE SEXUAL ACTIVITY    .  spironolactone (ALDACTONE) 25 MG tablet Take 0.5 tablets (12.5 mg total) by mouth daily. 15 tablet 3   No current facility-administered medications for this encounter.    BP 121/78   Pulse 66   Ht 5\' 6"  (1.676 m)   Wt 176 lb (79.8 kg)   SpO2 97%   BMI 28.41 kg/m  General: NAD Neck: JVP 8 cm with HJR, no thyromegaly or thyroid nodule.  Lungs: Clear to auscultation bilaterally with normal respiratory effort. CV: Nondisplaced PMI.  Heart regular S1/S2, no S3/S4, 1/6 SEM RUSB. 1+ ankle edema.  No carotid bruit.  Normal pedal pulses.  Abdomen: Soft, nontender, no hepatosplenomegaly, no distention.  Skin: Intact without lesions or rashes.  Neurologic: Alert and oriented x 3.  Psych: Normal affect. Extremities: No clubbing or cyanosis.  HEENT: Normal.   Assessment/Plan: 1. Chronic systolic CHF: Ischemic cardiomyopathy.  Has known occluded  LAD and RCA.  Long-standing low EF.  He is very mildly volume overloaded on exam with NYHA class II symptoms.   - Continue current Lasix, 40 mg daily for now.  - Continue Entresto 49/51 bid and Coreg 25 mg bid.  - Stop hydralazine.  - Add spironolactone 12.5 mg daily with BMET today and again in 10 days.  - Will need repeat echo in 8/19 to decide on ICD.  He will not be CRT candidate with narrow QRS.  - Followup in HF pharmacy clinic in 3 wks.  If BP and creatinine stable, would increase Entresto to 97/103 bid.  Can cut back on amlodipine at that time if needed.  2. CAD: Occluded RCA and LAD on 6/12 cath with no intervention. No chest pain.  - Can decrease ASA to 81 mg daily.   - Continue statin, check lipids today. 3. CKD: Stage 3.  Follow creatinine with med changes.  4. Aortic insufficiency: Mild on most recent echo.   Followup with HF pharmacy clinic 3 wks and me in 6 wks.   Marca Ancona 11/17/2017

## 2017-11-18 ENCOUNTER — Encounter (HOSPITAL_COMMUNITY): Payer: Self-pay

## 2017-11-25 ENCOUNTER — Other Ambulatory Visit (HOSPITAL_COMMUNITY): Payer: 59

## 2017-11-26 ENCOUNTER — Ambulatory Visit (HOSPITAL_COMMUNITY)
Admission: RE | Admit: 2017-11-26 | Discharge: 2017-11-26 | Disposition: A | Discharge status: Home / Self Care | Payer: 59 | Source: Ambulatory Visit | Attending: Internal Medicine | Admitting: Internal Medicine

## 2017-11-26 DIAGNOSIS — I255 Ischemic cardiomyopathy: Secondary | ICD-10-CM | POA: Insufficient documentation

## 2017-11-26 LAB — BASIC METABOLIC PANEL
Anion gap: 12 (ref 5–15)
BUN: 28 mg/dL — AB (ref 6–20)
CALCIUM: 9.3 mg/dL (ref 8.9–10.3)
CHLORIDE: 100 mmol/L — AB (ref 101–111)
CO2: 25 mmol/L (ref 22–32)
CREATININE: 1.98 mg/dL — AB (ref 0.61–1.24)
GFR calc non Af Amer: 34 mL/min — ABNORMAL LOW (ref >60)
GFR, EST AFRICAN AMERICAN: 39 mL/min — AB (ref >60)
GLUCOSE: 117 mg/dL — AB (ref 65–99)
Potassium: 4.5 mmol/L (ref 3.5–5.1)
Sodium: 137 mmol/L (ref 135–145)

## 2017-12-13 ENCOUNTER — Telehealth (HOSPITAL_COMMUNITY): Payer: Self-pay

## 2017-12-13 ENCOUNTER — Encounter (HOSPITAL_COMMUNITY): Payer: Self-pay

## 2017-12-13 NOTE — Telephone Encounter (Signed)
Notes recorded by Teresa Coombs, RN on 12/13/2017 at 10:36 AM EDT I have been unable to reach this patient by phone. A letter is being sent.  ------  Notes recorded by Teresa Coombs, RN on 12/12/2017 at 2:33 PM EDT Left VM  ------  Notes recorded by Teresa Coombs, RN on 11/28/2017 at 9:03 AM EDT Called tried to leave VM ------  Notes recorded by Laurey Morale, MD on 11/27/2017 at 2:36 AM EDT Creatinine up a bit, repeat BMET 2 wks.

## 2017-12-31 ENCOUNTER — Encounter (HOSPITAL_COMMUNITY): Payer: Self-pay | Admitting: Cardiology

## 2017-12-31 ENCOUNTER — Other Ambulatory Visit: Payer: Self-pay

## 2017-12-31 ENCOUNTER — Ambulatory Visit (HOSPITAL_COMMUNITY)
Admission: RE | Admit: 2017-12-31 | Discharge: 2017-12-31 | Disposition: A | Discharge status: Home / Self Care | Payer: 59 | Source: Ambulatory Visit | Attending: Cardiology | Admitting: Cardiology

## 2017-12-31 VITALS — BP 127/78 | HR 63 | Wt 172.0 lb

## 2017-12-31 DIAGNOSIS — I2510 Atherosclerotic heart disease of native coronary artery without angina pectoris: Secondary | ICD-10-CM | POA: Diagnosis not present

## 2017-12-31 DIAGNOSIS — I5022 Chronic systolic (congestive) heart failure: Secondary | ICD-10-CM | POA: Diagnosis not present

## 2017-12-31 DIAGNOSIS — I255 Ischemic cardiomyopathy: Secondary | ICD-10-CM | POA: Diagnosis not present

## 2017-12-31 LAB — BASIC METABOLIC PANEL
Anion gap: 10 (ref 5–15)
BUN: 37 mg/dL — AB (ref 6–20)
CHLORIDE: 105 mmol/L (ref 101–111)
CO2: 24 mmol/L (ref 22–32)
CREATININE: 1.79 mg/dL — AB (ref 0.61–1.24)
Calcium: 9.4 mg/dL (ref 8.9–10.3)
GFR calc Af Amer: 44 mL/min — ABNORMAL LOW (ref >60)
GFR calc non Af Amer: 38 mL/min — ABNORMAL LOW (ref >60)
Glucose, Bld: 119 mg/dL — ABNORMAL HIGH (ref 65–99)
Potassium: 4 mmol/L (ref 3.5–5.1)
Sodium: 139 mmol/L (ref 135–145)

## 2017-12-31 MED ORDER — SPIRONOLACTONE 25 MG PO TABS: 12.5000 mg | ORAL_TABLET | Freq: Every day | ORAL | 3 refills | Status: DC

## 2017-12-31 MED ORDER — FUROSEMIDE 20 MG PO TABS: 20.0000 mg | ORAL_TABLET | Freq: Every day | ORAL | 3 refills | Status: DC

## 2017-12-31 NOTE — Patient Instructions (Signed)
Increase Spironolactone 25 mg (1 tab) daily  Decrease Furosemide 20 mg (1 tab) daily  Your physician has requested that you have an echocardiogram. Echocardiography is a painless test that uses sound waves to create images of your heart. It provides your doctor with information about the size and shape of your heart and how well your heart's chambers and valves are working. This procedure takes approximately one hour. There are no restrictions for this procedure.  (they will call you)    Labs drawn today (if we do not call you, then your lab work was stable)   Your physician recommends that you return for lab work in: 10 days  Your physician recommends that you schedule a follow-up appointment in: 3 months with Dr. Shirlee Latch  an a echocardiogram

## 2017-12-31 NOTE — Progress Notes (Signed)
PCP: Pearson Grippe Cardiology: Dr. Allyson Sabal HF Cardiology: Dr. Shirlee Latch  67 yo with history of CAD and chronic systolic CHF/ischemic cardiomyopathy as well as CKD stage 3 was referred by Dr. Allyson Sabal for evaluation of CHF.  Patient had an MI in 6/12.  At that time, cath showed EF 25% by LV-gram with occluded LAD and RCA, no intervention.  Since then, EF has been low.  Most recent echo in 2/19 showed EF 30-35% with regional WMAs.  He has CKD stage 3, most recent creatinine was 1.98.    Patient returns for followup of CHF.  He is doing well symptomatically.  Minimal dyspnea, only noted when jogging or walking fast. No chest pain.  No orthopnea/PND.  No lightheadedness.  Weight is down 4 lbs.   ECG (2/19): NSR, RBBB, old inferior and anterior MIs  Labs (3/19): K 4, creatinine 1.72 Labs (4/19): K 4.5, creatinine 1.98, LDL 59, HDL 29  PMH: 1. HTN 2. OSA: Uses CPAP 3. CKD: Stage 3, likely hypertensive nephropathy.  4. Hyperlipidemia 5. CAD: MI in 6/12.  LHC (6/12) with totally occluded RCA, totally occluded LAD, patent LCx.  No intervention.  6. Chronic systolic CHF: Ischemic cardiomyopathy.  - EF 25% by LV-gram in 2012.   - Echo (8/13) with EF 35-40%.   - Echo (2/19) with EF 30-35%, wall motion abnormalities, normal RV size and systolic function, mild AI.  7. Aortic insufficiency: Moderate on 4/18 echo.  Mild on 2/19 echo.  8. Gout  SH: Lives in [place], works in Agricultural engineer, from [place], remote smoker. No ETOH or drugs.   Family History  Problem Relation Age of Onset  . Diabetes Mother   . Heart attack Father   . Stroke Father    ROS: All systems reviewed and negative except as per HPI.  Current Outpatient Medications  Medication Sig Dispense Refill  . allopurinol (ZYLOPRIM) 100 MG tablet Take 100 mg by mouth daily.    Marland Kitchen amLODipine (NORVASC) 5 MG tablet TAKE 1 TABLET BY MOUTH  DAILY 90 tablet 3  . aspirin 81 MG tablet Take 1 tablet (81 mg total) by mouth daily. 30 tablet 3  . atorvastatin  (LIPITOR) 40 MG tablet Take 1 tablet (40 mg total) by mouth daily at 6 PM. 90 tablet 1  . carvedilol (COREG) 25 MG tablet TAKE 1 TABLET BY MOUTH TWO  TIMES DAILY 180 tablet 3  . clopidogrel (PLAVIX) 75 MG tablet Take 1 tablet (75 mg total) by mouth daily. 90 tablet 1  . colchicine 0.6 MG tablet Take 0.6 mg by mouth as needed.     . Famotidine (PEPCID AC PO) Take 1 tablet by mouth daily.    . furosemide (LASIX) 20 MG tablet Take 1 tablet (20 mg total) by mouth daily. 30 tablet 3  . Icosapent Ethyl (VASCEPA) 1 g CAPS Take 2 tablets by mouth 2 (two) times daily.    . Multiple Vitamin (MULTIVITAMIN) tablet Take 1 tablet by mouth daily.    . nitroGLYCERIN (NITROSTAT) 0.4 MG SL tablet Place 1 tablet (0.4 mg total) under the tongue every 5 (five) minutes as needed. 25 tablet 11  . RESTASIS 0.05 % ophthalmic emulsion     . sacubitril-valsartan (ENTRESTO) 49-51 MG Take 1 tablet by mouth 2 (two) times daily. 60 tablet 3  . sildenafil (VIAGRA) 50 MG tablet TAKE 1/2 TO 1 TABLET BY MOUTH 1 HOUR BEFORE SEXUAL ACTIVITY    . spironolactone (ALDACTONE) 25 MG tablet Take 0.5 tablets (12.5 mg total) by  mouth daily. 15 tablet 3   No current facility-administered medications for this encounter.    BP 127/78   Pulse 63   Wt 172 lb (78 kg)   SpO2 98%   BMI 27.76 kg/m  General: NAD Neck: No JVD, no thyromegaly or thyroid nodule.  Lungs: Clear to auscultation bilaterally with normal respiratory effort. CV: Nondisplaced PMI.  Heart regular S1/S2, no S3/S4, 1/6 SEM RUSB.  No peripheral edema.  No carotid bruit.  Normal pedal pulses.  Abdomen: Soft, nontender, no hepatosplenomegaly, no distention.  Skin: Intact without lesions or rashes.  Neurologic: Alert and oriented x 3.  Psych: Normal affect. Extremities: No clubbing or cyanosis.  HEENT: Normal.   Assessment/Plan: 1. Chronic systolic CHF: Ischemic cardiomyopathy.  Has known occluded LAD and RCA.  Long-standing low EF. NYHA class II symptoms.  He is not  volume overloaded on exam. - Continue Entresto 49/51 bid and Coreg 25 mg bid.  - Increase spironolactone to 25 mg daily with BMET today and again in 10 days.  - I think he can decrease Lasix to 20 mg daily.  He can restart if weight rises (weigh daily) or dyspnea worsens.  - Will need repeat echo in 8/19 to decide on ICD.  He will not be CRT candidate with narrow QRS.  2. CAD: Occluded RCA and LAD on 6/12 cath with no intervention. No chest pain.  - Continue ASA 81 daily.   - Continue statin, good LDL in 4/19.  3. CKD: Stage 3.  Follow creatinine with med changes.  4. Aortic insufficiency: Mild on most recent echo.   Followup in 8/19 with echo.   Marca Ancona 12/31/2017

## 2018-01-07 ENCOUNTER — Other Ambulatory Visit: Payer: Self-pay | Admitting: Cardiovascular Disease

## 2018-01-13 ENCOUNTER — Other Ambulatory Visit (HOSPITAL_COMMUNITY): Payer: 59

## 2018-01-15 ENCOUNTER — Other Ambulatory Visit (HOSPITAL_COMMUNITY): Payer: Self-pay | Admitting: *Deleted

## 2018-01-15 ENCOUNTER — Ambulatory Visit (HOSPITAL_COMMUNITY)
Admission: RE | Admit: 2018-01-15 | Discharge: 2018-01-15 | Disposition: A | Discharge status: Home / Self Care | Payer: 59 | Source: Ambulatory Visit | Attending: Internal Medicine | Admitting: Internal Medicine

## 2018-01-15 DIAGNOSIS — I255 Ischemic cardiomyopathy: Secondary | ICD-10-CM | POA: Diagnosis not present

## 2018-01-15 LAB — BASIC METABOLIC PANEL
ANION GAP: 11 (ref 5–15)
BUN: 25 mg/dL — AB (ref 6–20)
CALCIUM: 9.5 mg/dL (ref 8.9–10.3)
CO2: 23 mmol/L (ref 22–32)
Chloride: 107 mmol/L (ref 101–111)
Creatinine, Ser: 1.58 mg/dL — ABNORMAL HIGH (ref 0.61–1.24)
GFR calc Af Amer: 51 mL/min — ABNORMAL LOW (ref >60)
GFR, EST NON AFRICAN AMERICAN: 44 mL/min — AB (ref >60)
GLUCOSE: 125 mg/dL — AB (ref 65–99)
Potassium: 4.3 mmol/L (ref 3.5–5.1)
SODIUM: 141 mmol/L (ref 135–145)

## 2018-01-15 MED ORDER — SPIRONOLACTONE 25 MG PO TABS: 25.0000 mg | ORAL_TABLET | Freq: Every day | ORAL | 3 refills | Status: DC

## 2018-01-22 ENCOUNTER — Other Ambulatory Visit: Payer: Self-pay | Admitting: Cardiovascular Disease

## 2018-01-28 ENCOUNTER — Ambulatory Visit: Payer: 59 | Admitting: Cardiovascular Disease

## 2018-03-18 ENCOUNTER — Other Ambulatory Visit (HOSPITAL_COMMUNITY): Payer: 59

## 2018-03-21 ENCOUNTER — Ambulatory Visit (HOSPITAL_COMMUNITY)
Admission: RE | Admit: 2018-03-21 | Discharge: 2018-03-21 | Disposition: A | Discharge status: Home / Self Care | Payer: 59 | Source: Ambulatory Visit | Attending: Cardiology | Admitting: Cardiology

## 2018-03-21 ENCOUNTER — Other Ambulatory Visit: Payer: Self-pay

## 2018-03-21 ENCOUNTER — Ambulatory Visit (HOSPITAL_BASED_OUTPATIENT_CLINIC_OR_DEPARTMENT_OTHER)
Admission: RE | Admit: 2018-03-21 | Discharge: 2018-03-21 | Disposition: A | Discharge status: Home / Self Care | Payer: 59 | Source: Ambulatory Visit | Attending: Cardiology | Admitting: Cardiology

## 2018-03-21 VITALS — BP 110/72 | HR 60 | Wt 172.0 lb

## 2018-03-21 DIAGNOSIS — M109 Gout, unspecified: Secondary | ICD-10-CM | POA: Insufficient documentation

## 2018-03-21 DIAGNOSIS — I5022 Chronic systolic (congestive) heart failure: Secondary | ICD-10-CM | POA: Insufficient documentation

## 2018-03-21 DIAGNOSIS — Z79899 Other long term (current) drug therapy: Secondary | ICD-10-CM | POA: Diagnosis not present

## 2018-03-21 DIAGNOSIS — I351 Nonrheumatic aortic (valve) insufficiency: Secondary | ICD-10-CM | POA: Diagnosis not present

## 2018-03-21 DIAGNOSIS — Z7902 Long term (current) use of antithrombotics/antiplatelets: Secondary | ICD-10-CM | POA: Insufficient documentation

## 2018-03-21 DIAGNOSIS — I2510 Atherosclerotic heart disease of native coronary artery without angina pectoris: Secondary | ICD-10-CM

## 2018-03-21 DIAGNOSIS — I252 Old myocardial infarction: Secondary | ICD-10-CM | POA: Diagnosis not present

## 2018-03-21 DIAGNOSIS — Z7982 Long term (current) use of aspirin: Secondary | ICD-10-CM | POA: Diagnosis not present

## 2018-03-21 DIAGNOSIS — N183 Chronic kidney disease, stage 3 (moderate): Secondary | ICD-10-CM | POA: Diagnosis not present

## 2018-03-21 DIAGNOSIS — I712 Thoracic aortic aneurysm, without rupture: Secondary | ICD-10-CM | POA: Insufficient documentation

## 2018-03-21 DIAGNOSIS — I255 Ischemic cardiomyopathy: Secondary | ICD-10-CM

## 2018-03-21 DIAGNOSIS — G4733 Obstructive sleep apnea (adult) (pediatric): Secondary | ICD-10-CM | POA: Diagnosis not present

## 2018-03-21 DIAGNOSIS — E785 Hyperlipidemia, unspecified: Secondary | ICD-10-CM | POA: Insufficient documentation

## 2018-03-21 DIAGNOSIS — Z87891 Personal history of nicotine dependence: Secondary | ICD-10-CM | POA: Diagnosis not present

## 2018-03-21 DIAGNOSIS — I130 Hypertensive heart and chronic kidney disease with heart failure and stage 1 through stage 4 chronic kidney disease, or unspecified chronic kidney disease: Secondary | ICD-10-CM | POA: Insufficient documentation

## 2018-03-21 LAB — BASIC METABOLIC PANEL
Anion gap: 9 (ref 5–15)
BUN: 36 mg/dL — ABNORMAL HIGH (ref 8–23)
CALCIUM: 9.5 mg/dL (ref 8.9–10.3)
CO2: 22 mmol/L (ref 22–32)
CREATININE: 1.94 mg/dL — AB (ref 0.61–1.24)
Chloride: 106 mmol/L (ref 98–111)
GFR calc non Af Amer: 34 mL/min — ABNORMAL LOW (ref >60)
GFR, EST AFRICAN AMERICAN: 40 mL/min — AB (ref >60)
Glucose, Bld: 139 mg/dL — ABNORMAL HIGH (ref 70–99)
Potassium: 4.4 mmol/L (ref 3.5–5.1)
SODIUM: 137 mmol/L (ref 135–145)

## 2018-03-21 NOTE — Progress Notes (Signed)
  Echocardiogram 2D Echocardiogram has been performed.  Janalyn HarderWest, Knox Holdman R 03/21/2018, 12:12 PM

## 2018-03-21 NOTE — Patient Instructions (Signed)
Labs today (will call for abnormal results, otherwise no news is good news)  Cardiac MRI and MRA of chest has been ordered for you, once insurance approves we'll call you to schedule test.  Follow up in 4 months

## 2018-03-24 NOTE — Progress Notes (Signed)
PCP: Pearson GrippeJames Kim Cardiology: Dr. Allyson SabalBerry HF Cardiology: Dr. Shirlee LatchMcLean  67 y.o.with history of CAD and chronic systolic CHF/ischemic cardiomyopathy as well as CKD stage 3 was referred by Dr. Allyson SabalBerry for evaluation of CHF.  Patient had an MI in 6/12.  At that time, cath showed EF 25% by LV-gram with occluded LAD and RCA, no intervention.  Since then, EF has been low.  Echo in 2/19 showed EF 30-35% with regional WMAs.  He has CKD stage 3, most recent creatinine was 1.58.    Echo was done today and reviewed, EF 35-40% with wall motion abnormalities.   Patient returns for followup of CHF.  He denies significant exertional dyspnea or chest pain.  He has a mild chronic cough that he thinks is due to [facility] but says that the cough is manageable and he is comfortable continuing Entresto.  Weight is stable.   Labs (3/19): K 4, creatinine 1.72 Labs (4/19): K 4.5, creatinine 1.98, LDL 59, HDL 29 Labs (6/19): K 4.3, creatinine 1.58  PMH: 1. HTN 2. OSA: Uses CPAP 3. CKD: Stage 3, likely hypertensive nephropathy.  4. Hyperlipidemia 5. CAD: MI in 6/12.  LHC (6/12) with totally occluded RCA, totally occluded LAD, patent LCx.  No intervention.  6. Chronic systolic CHF: Ischemic cardiomyopathy.  - EF 25% by LV-gram in 2012.   - Echo (8/13) with EF 35-40%.   - Echo (2/19) with EF 30-35%, wall motion abnormalities, normal RV size and systolic function, mild AI.  - Echo (8/19): EF 35-40%, apex/apical septal/apical anterior AK, basal-mid inferior AK, normal RV size and systolic function, mild AI, ascending thoracic aorta 4.4 cm.  7. Aortic insufficiency: Moderate on 4/18 echo.  Mild on 8/19 echo.  8. Gout 9. Ascending aortic aneurysm: 4.4 cm on 8/19 echo.   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 No ETOH or drugs.   Family History  Problem Relation Age of Onset  . Diabetes Mother   . Heart attack Father   . Stroke Father    ROS: All systems reviewed and negative except as per  HPI.  Current Outpatient Medications  Medication Sig Dispense Refill  . allopurinol (ZYLOPRIM) 300 MG tablet Take 150 mg by mouth daily.    Marland Kitchen. amLODipine (NORVASC) 5 MG tablet TAKE 1 TABLET BY MOUTH  DAILY 90 tablet 3  . aspirin 81 MG tablet Take 1 tablet (81 mg total) by mouth daily. 30 tablet 3  . atorvastatin (LIPITOR) 40 MG tablet TAKE 1 TABLET BY MOUTH  DAILY AT 6 PM. 90 tablet 1  . carvedilol (COREG) 25 MG tablet TAKE 1 TABLET BY MOUTH TWO  TIMES DAILY 180 tablet 3  . clopidogrel (PLAVIX) 75 MG tablet TAKE 1 TABLET BY MOUTH  DAILY 90 tablet 3  . colchicine 0.6 MG tablet Take 0.6 mg by mouth as needed.     . Famotidine (PEPCID AC PO) Take 1 tablet by mouth daily.    . furosemide (LASIX) 20 MG tablet Take 10 mg by mouth daily.    Bess Harvest. Icosapent Ethyl (VASCEPA) 1 g CAPS Take 2 tablets by mouth 2 (two) times daily.    . Multiple Vitamin (MULTIVITAMIN) tablet Take 1 tablet by mouth daily.    . nitroGLYCERIN (NITROSTAT) 0.4 MG SL tablet Place 1 tablet (0.4 mg total) under the tongue every 5 (five) minutes as needed. 25 tablet 11  . RESTASIS 0.05 % ophthalmic emulsion     . sacubitril-valsartan (ENTRESTO) 49-51 MG Take 1 tablet  by mouth 2 (two) times daily. 60 tablet 3  . sildenafil (VIAGRA) 50 MG tablet TAKE 1/2 TO 1 TABLET BY MOUTH 1 HOUR BEFORE SEXUAL ACTIVITY    . spironolactone (ALDACTONE) 25 MG tablet Take 25 mg by mouth daily.    . TURMERIC PO Take 1 tablet by mouth daily.     No current facility-administered medications for this encounter.    BP 110/72 (BP Location: Left Arm, Patient Position: Sitting)   Pulse 60   Wt 78 kg (172 lb)   SpO2 100%   BMI 27.76 kg/m   Assessment/Plan: 1. Chronic systolic CHF: Ischemic cardiomyopathy.  Has known occluded LAD and RCA.  Long-standing low EF. NYHA class II symptoms.  He is not volume overloaded on exam.  Most recent echo in 8/19 with EF 35-40%.   - Continue Entresto 49/51 bid and Coreg 25 mg bid. I will not increase Entresto as it may be  causing his chronic cough.  - Continue spironolactone 25 mg daily.  - Continue Lasix 10 mg daily, BMET today.  - He is borderline for ICD, just above range by echo.  I will get a cardiac MRI to more accurately confirm EF.  He will not be CRT candidate with narrow QRS.  2. CAD: Occluded RCA and LAD on 6/12 cath with no intervention. No chest pain.  - Continue ASA 81 daily.   - Continue statin, good LDL in 4/19.  3. CKD: Stage 3.  BMET today.   4. Aortic insufficiency: Mild on most recent echo.  5. Ascending aortic aneurysm: 4.4 cm on echo 8/19.  Will get MRA chest at time of cardiac MRI to more closely evaluate.   Followup in 4 months  Daniel Hurst 03/24/2018

## 2018-03-28 ENCOUNTER — Other Ambulatory Visit: Payer: Self-pay | Admitting: Cardiovascular Disease

## 2018-06-19 ENCOUNTER — Other Ambulatory Visit (HOSPITAL_COMMUNITY): Payer: Self-pay | Admitting: Cardiology

## 2018-07-14 ENCOUNTER — Other Ambulatory Visit: Payer: Self-pay | Admitting: Cardiovascular Disease

## 2018-07-16 NOTE — Telephone Encounter (Signed)
Rx request sent to pharmacy.  

## 2018-07-18 ENCOUNTER — Ambulatory Visit (HOSPITAL_COMMUNITY)
Admission: RE | Admit: 2018-07-18 | Discharge: 2018-07-18 | Disposition: A | Discharge status: Home / Self Care | Payer: 59 | Source: Ambulatory Visit | Attending: Cardiology | Admitting: Cardiology

## 2018-07-18 ENCOUNTER — Telehealth (HOSPITAL_COMMUNITY): Payer: Self-pay

## 2018-07-18 ENCOUNTER — Encounter (HOSPITAL_COMMUNITY): Payer: Self-pay | Admitting: Cardiology

## 2018-07-18 VITALS — BP 104/62 | HR 62 | Wt 174.2 lb

## 2018-07-18 DIAGNOSIS — I2510 Atherosclerotic heart disease of native coronary artery without angina pectoris: Secondary | ICD-10-CM | POA: Insufficient documentation

## 2018-07-18 DIAGNOSIS — N183 Chronic kidney disease, stage 3 unspecified: Secondary | ICD-10-CM

## 2018-07-18 DIAGNOSIS — I5022 Chronic systolic (congestive) heart failure: Secondary | ICD-10-CM | POA: Insufficient documentation

## 2018-07-18 DIAGNOSIS — Z79899 Other long term (current) drug therapy: Secondary | ICD-10-CM | POA: Insufficient documentation

## 2018-07-18 DIAGNOSIS — E785 Hyperlipidemia, unspecified: Secondary | ICD-10-CM | POA: Insufficient documentation

## 2018-07-18 DIAGNOSIS — Z7982 Long term (current) use of aspirin: Secondary | ICD-10-CM | POA: Insufficient documentation

## 2018-07-18 DIAGNOSIS — I712 Thoracic aortic aneurysm, without rupture: Secondary | ICD-10-CM | POA: Insufficient documentation

## 2018-07-18 DIAGNOSIS — I4510 Unspecified right bundle-branch block: Secondary | ICD-10-CM | POA: Insufficient documentation

## 2018-07-18 DIAGNOSIS — Z8249 Family history of ischemic heart disease and other diseases of the circulatory system: Secondary | ICD-10-CM | POA: Insufficient documentation

## 2018-07-18 DIAGNOSIS — Z87891 Personal history of nicotine dependence: Secondary | ICD-10-CM | POA: Insufficient documentation

## 2018-07-18 DIAGNOSIS — G4733 Obstructive sleep apnea (adult) (pediatric): Secondary | ICD-10-CM | POA: Diagnosis not present

## 2018-07-18 DIAGNOSIS — I255 Ischemic cardiomyopathy: Secondary | ICD-10-CM | POA: Insufficient documentation

## 2018-07-18 DIAGNOSIS — I130 Hypertensive heart and chronic kidney disease with heart failure and stage 1 through stage 4 chronic kidney disease, or unspecified chronic kidney disease: Secondary | ICD-10-CM | POA: Diagnosis not present

## 2018-07-18 DIAGNOSIS — M109 Gout, unspecified: Secondary | ICD-10-CM | POA: Diagnosis not present

## 2018-07-18 DIAGNOSIS — I351 Nonrheumatic aortic (valve) insufficiency: Secondary | ICD-10-CM | POA: Insufficient documentation

## 2018-07-18 LAB — BASIC METABOLIC PANEL
Anion gap: 12 (ref 5–15)
BUN: 48 mg/dL — AB (ref 8–23)
CALCIUM: 9.2 mg/dL (ref 8.9–10.3)
CO2: 20 mmol/L — ABNORMAL LOW (ref 22–32)
Chloride: 107 mmol/L (ref 98–111)
Creatinine, Ser: 2.12 mg/dL — ABNORMAL HIGH (ref 0.61–1.24)
GFR calc Af Amer: 36 mL/min — ABNORMAL LOW (ref >60)
GFR, EST NON AFRICAN AMERICAN: 31 mL/min — AB (ref >60)
GLUCOSE: 150 mg/dL — AB (ref 70–99)
Potassium: 4.6 mmol/L (ref 3.5–5.1)
Sodium: 139 mmol/L (ref 135–145)

## 2018-07-18 MED ORDER — NITROGLYCERIN 0.4 MG SL SUBL: 0.4000 mg | SUBLINGUAL_TABLET | SUBLINGUAL | 11 refills | Status: AC | PRN

## 2018-07-18 NOTE — Patient Instructions (Signed)
DISCONTINUE Lasix  Your physician has requested that you have an echocardiogram. This is to be done in January. Echocardiography is a painless test that uses sound waves to create images of your heart. It provides your doctor with information about the size and shape of your heart and how well your heart's chambers and valves are working. This procedure takes approximately one hour. There are no restrictions for this procedure.  Labs today. If kidney function is better on today's labs, we will cancel ECHO and order an MRI of your heart.   Your physician recommends that you schedule a follow-up appointment in: 3 months with Dr. Shirlee LatchMcLean

## 2018-07-18 NOTE — Telephone Encounter (Signed)
Pt called no answer voice mail left for pt to call back 

## 2018-07-20 NOTE — Progress Notes (Signed)
PCP: Pearson GrippeJames Kim Cardiology: Dr. Allyson SabalBerry HF Cardiology: Dr. Shirlee LatchMcLean  67 y.o.with history of CAD and chronic systolic CHF/ischemic cardiomyopathy as well as CKD stage 3 was referred by Dr. Allyson SabalBerry for evaluation of CHF.  Patient had an MI in 6/12.  At that time, cath showed EF 25% by LV-gram with occluded LAD and RCA, no intervention.  Since then, EF has been low.  Echo in 2/19 showed EF 30-35% with regional WMAs.  He has CKD stage 3, most recent creatinine was 1.58.    Echo in 8/19 showed EF 35-40% with wall motion abnormalities.   Patient returns for followup of CHF.  Weight is stable.  No chest pain.  No significant exertional dyspnea. No orthopnea/PND.  He has a mild chronic cough that he feels is due to Vine[place]resto, he thinks that it is tolerable.    Labs (3/19): K 4, creatinine 1.72 Labs (4/19): K 4.5, creatinine 1.98, LDL 59, HDL 29 Labs (6/19): K 4.3, creatinine 1.58 Labs (8/19): K 4.4, creatinine 1.94  ECG (personally reviewed): NSR, RBBB with QRS 160 msec  PMH: 1. HTN 2. OSA: Uses CPAP 3. CKD: Stage 3, likely hypertensive nephropathy.  4. Hyperlipidemia 5. CAD: MI in 6/12.  LHC (6/12) with totally occluded RCA, totally occluded LAD, patent LCx.  No intervention.  6. Chronic systolic CHF: Ischemic cardiomyopathy.  - EF 25% by LV-gram in 2012.   - Echo (8/13) with EF 35-40%.   - Echo (2/19) with EF 30-35%, wall motion abnormalities, normal RV size and systolic function, mild AI.  - Echo (8/19): EF 35-40%, apex/apical septal/apical anterior AK, basal-mid inferior AK, normal RV size and systolic function, mild AI, ascending thoracic aorta 4.4 cm.  7. Aortic insufficiency: Moderate on 4/18 echo.  Mild on 8/19 echo.  8. Gout 9. Ascending aortic aneurysm: 4.4 cm on 8/19 echo.   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 No ETOH or drugs.   Family History  Problem Relation Age of Onset  . Diabetes Mother   . Heart attack Father   . Stroke Father    ROS:  All systems reviewed and negative except as per HPI.  Current Outpatient Medications  Medication Sig Dispense Refill  . allopurinol (ZYLOPRIM) 300 MG tablet Take 150 mg by mouth daily.    Marland Kitchen. amLODipine (NORVASC) 5 MG tablet TAKE 1 TABLET BY MOUTH  DAILY 90 tablet 0  . aspirin 81 MG tablet Take 1 tablet (81 mg total) by mouth daily. 30 tablet 3  . atorvastatin (LIPITOR) 40 MG tablet TAKE 1 TABLET BY MOUTH  DAILY AT 6 PM. 90 tablet 1  . carvedilol (COREG) 25 MG tablet TAKE 1 TABLET BY MOUTH TWO  TIMES DAILY 180 tablet 3  . clopidogrel (PLAVIX) 75 MG tablet TAKE 1 TABLET BY MOUTH  DAILY 90 tablet 3  . colchicine 0.6 MG tablet Take 0.6 mg by mouth as needed.     Marland Kitchen. ENTRESTO 49-51 MG TAKE ONE TABLET BY MOUTH TWICE A DAY 60 tablet 2  . Famotidine (PEPCID AC PO) Take 1 tablet by mouth daily.    Bess Harvest. Icosapent Ethyl (VASCEPA) 1 g CAPS Take 2 tablets by mouth 2 (two) times daily.    . Multiple Vitamin (MULTIVITAMIN) tablet Take 1 tablet by mouth daily.    . RESTASIS 0.05 % ophthalmic emulsion     . sildenafil (VIAGRA) 50 MG tablet TAKE 1/2 TO 1 TABLET BY MOUTH 1 HOUR BEFORE SEXUAL ACTIVITY    . spironolactone (  ALDACTONE) 25 MG tablet Take 25 mg by mouth daily.    . TURMERIC PO Take 1 tablet by mouth daily.    . nitroGLYCERIN (NITROSTAT) 0.4 MG SL tablet Place 1 tablet (0.4 mg total) under the tongue every 5 (five) minutes as needed. 25 tablet 11   No current facility-administered medications for this encounter.    BP 104/62   Pulse 62   Wt 79 kg (174 lb 3.2 oz)   SpO2 98%   BMI 28.12 kg/m   General: NAD Neck: No JVD, no thyromegaly or thyroid nodule.  Lungs: Clear to auscultation bilaterally with normal respiratory effort. CV: Nondisplaced PMI.  Heart regular S1/S2, no S3/S4, no murmur.  No peripheral edema.  No carotid bruit.  Normal pedal pulses.  Abdomen: Soft, nontender, no hepatosplenomegaly, no distention.  Skin: Intact without lesions or rashes.  Neurologic: Alert and oriented x 3.   Psych: Normal affect. Extremities: No clubbing or cyanosis.  HEENT: Normal.   Assessment/Plan: 1. Chronic systolic CHF: Ischemic cardiomyopathy.  Has known occluded LAD and RCA.  Long-standing low EF. NYHA class II symptoms.  He is not volume overloaded on exam.  Most recent echo in 8/19 with EF 35-40%.   - Continue Entresto 49/51 bid and Coreg 25 mg bid. I will not increase Entresto as it may be causing his chronic cough.  - Continue spironolactone 25 mg daily.  - I think that he can stop Lasix, especially with elevated creatinine.  - He is borderline for ICD, just above range by echo.  I will get a cardiac MRI for more accurate quantification of EF but will not be able to give contrast with creatinine up to 2.12 today.  He has a wide RBBB, 160 msec (if gets ICD, would consider CRT => would need to discuss with EP). 2. CAD: Occluded RCA and LAD on 6/12 cath with no intervention. No chest pain.  - Continue ASA 81 daily.   - Continue statin, good LDL in 4/19.  3. CKD: Stage 3.  Creatinine today up to 2.12.  - As above, stop Lasix.   4. Aortic insufficiency: Mild on most recent echo.  5. Ascending aortic aneurysm: 4.4 cm on echo 8/19.  Will not be able to get MRA chest with elevated creatinine.  Will follow with echo for now.   Followup in 3 months  Marca Ancona 07/20/2018

## 2018-07-21 ENCOUNTER — Other Ambulatory Visit (HOSPITAL_COMMUNITY): Payer: Self-pay | Admitting: *Deleted

## 2018-07-21 DIAGNOSIS — I5022 Chronic systolic (congestive) heart failure: Secondary | ICD-10-CM

## 2018-07-21 NOTE — Progress Notes (Signed)
Message sent to [facility] Hurst and Daniel GrimmerMarilyn Hurst to scheduled [facility].

## 2018-07-25 ENCOUNTER — Encounter: Payer: Self-pay | Admitting: Cardiology

## 2018-07-31 ENCOUNTER — Ambulatory Visit (HOSPITAL_COMMUNITY)
Admission: RE | Admit: 2018-07-31 | Discharge: 2018-07-31 | Disposition: A | Discharge status: Home / Self Care | Payer: 59 | Source: Ambulatory Visit | Attending: Internal Medicine | Admitting: Internal Medicine

## 2018-07-31 DIAGNOSIS — I5022 Chronic systolic (congestive) heart failure: Secondary | ICD-10-CM | POA: Insufficient documentation

## 2018-07-31 LAB — BASIC METABOLIC PANEL
Anion gap: 7 (ref 5–15)
BUN: 31 mg/dL — AB (ref 8–23)
CALCIUM: 9.3 mg/dL (ref 8.9–10.3)
CO2: 23 mmol/L (ref 22–32)
Chloride: 110 mmol/L (ref 98–111)
Creatinine, Ser: 1.83 mg/dL — ABNORMAL HIGH (ref 0.61–1.24)
GFR calc Af Amer: 44 mL/min — ABNORMAL LOW (ref >60)
GFR, EST NON AFRICAN AMERICAN: 38 mL/min — AB (ref >60)
GLUCOSE: 126 mg/dL — AB (ref 70–99)
Potassium: 4.4 mmol/L (ref 3.5–5.1)
Sodium: 140 mmol/L (ref 135–145)

## 2018-08-18 ENCOUNTER — Telehealth (HOSPITAL_COMMUNITY): Payer: Self-pay | Admitting: Emergency Medicine

## 2018-08-18 NOTE — Telephone Encounter (Signed)
Left message on voicemail with name and callback number Aleina Burgio RN [facility] [phone] 

## 2018-08-19 ENCOUNTER — Ambulatory Visit (HOSPITAL_COMMUNITY)
Admission: RE | Admit: 2018-08-19 | Discharge: 2018-08-19 | Disposition: A | Discharge status: Home / Self Care | Payer: 59 | Source: Ambulatory Visit | Attending: Internal Medicine | Admitting: Internal Medicine

## 2018-08-19 ENCOUNTER — Telehealth (HOSPITAL_COMMUNITY): Payer: Self-pay

## 2018-08-19 ENCOUNTER — Ambulatory Visit (HOSPITAL_COMMUNITY): Admission: RE | Admit: 2018-08-19 | Payer: 59 | Source: Ambulatory Visit

## 2018-08-19 DIAGNOSIS — I5022 Chronic systolic (congestive) heart failure: Secondary | ICD-10-CM | POA: Insufficient documentation

## 2018-08-19 DIAGNOSIS — I351 Nonrheumatic aortic (valve) insufficiency: Secondary | ICD-10-CM | POA: Diagnosis not present

## 2018-08-19 DIAGNOSIS — I255 Ischemic cardiomyopathy: Secondary | ICD-10-CM | POA: Diagnosis present

## 2018-08-19 NOTE — Telephone Encounter (Signed)
Orders put in for pt echo

## 2018-08-20 ENCOUNTER — Other Ambulatory Visit (HOSPITAL_COMMUNITY): Payer: Self-pay | Admitting: Cardiology

## 2018-08-20 ENCOUNTER — Other Ambulatory Visit: Payer: Self-pay | Admitting: Nephrology

## 2018-08-20 DIAGNOSIS — N183 Chronic kidney disease, stage 3 unspecified: Secondary | ICD-10-CM

## 2018-08-24 ENCOUNTER — Emergency Department (HOSPITAL_COMMUNITY): Payer: 59

## 2018-08-24 ENCOUNTER — Inpatient Hospital Stay (HOSPITAL_COMMUNITY)
Admission: EM | Admit: 2018-08-24 | Discharge: 2018-08-30 | DRG: 418 | Disposition: A | Discharge status: Home / Self Care | Payer: 59 | Attending: Internal Medicine | Admitting: Internal Medicine

## 2018-08-24 DIAGNOSIS — Z87891 Personal history of nicotine dependence: Secondary | ICD-10-CM | POA: Diagnosis not present

## 2018-08-24 DIAGNOSIS — M109 Gout, unspecified: Secondary | ICD-10-CM | POA: Diagnosis present

## 2018-08-24 DIAGNOSIS — R1013 Epigastric pain: Secondary | ICD-10-CM

## 2018-08-24 DIAGNOSIS — R945 Abnormal results of liver function studies: Secondary | ICD-10-CM

## 2018-08-24 DIAGNOSIS — R7989 Other specified abnormal findings of blood chemistry: Secondary | ICD-10-CM | POA: Diagnosis present

## 2018-08-24 DIAGNOSIS — E785 Hyperlipidemia, unspecified: Secondary | ICD-10-CM | POA: Diagnosis present

## 2018-08-24 DIAGNOSIS — D696 Thrombocytopenia, unspecified: Secondary | ICD-10-CM | POA: Diagnosis not present

## 2018-08-24 DIAGNOSIS — K8070 Calculus of gallbladder and bile duct without cholecystitis without obstruction: Secondary | ICD-10-CM | POA: Diagnosis present

## 2018-08-24 DIAGNOSIS — I255 Ischemic cardiomyopathy: Secondary | ICD-10-CM | POA: Diagnosis present

## 2018-08-24 DIAGNOSIS — N183 Chronic kidney disease, stage 3 (moderate): Secondary | ICD-10-CM | POA: Diagnosis present

## 2018-08-24 DIAGNOSIS — I252 Old myocardial infarction: Secondary | ICD-10-CM

## 2018-08-24 DIAGNOSIS — I5042 Chronic combined systolic (congestive) and diastolic (congestive) heart failure: Secondary | ICD-10-CM | POA: Diagnosis present

## 2018-08-24 DIAGNOSIS — Y92239 Unspecified place in hospital as the place of occurrence of the external cause: Secondary | ICD-10-CM | POA: Diagnosis not present

## 2018-08-24 DIAGNOSIS — I130 Hypertensive heart and chronic kidney disease with heart failure and stage 1 through stage 4 chronic kidney disease, or unspecified chronic kidney disease: Secondary | ICD-10-CM | POA: Diagnosis present

## 2018-08-24 DIAGNOSIS — Z79899 Other long term (current) drug therapy: Secondary | ICD-10-CM

## 2018-08-24 DIAGNOSIS — Z833 Family history of diabetes mellitus: Secondary | ICD-10-CM | POA: Diagnosis not present

## 2018-08-24 DIAGNOSIS — I10 Essential (primary) hypertension: Secondary | ICD-10-CM | POA: Diagnosis present

## 2018-08-24 DIAGNOSIS — Z823 Family history of stroke: Secondary | ICD-10-CM | POA: Diagnosis not present

## 2018-08-24 DIAGNOSIS — K8309 Other cholangitis: Secondary | ICD-10-CM

## 2018-08-24 DIAGNOSIS — I2510 Atherosclerotic heart disease of native coronary artery without angina pectoris: Secondary | ICD-10-CM | POA: Diagnosis present

## 2018-08-24 DIAGNOSIS — I351 Nonrheumatic aortic (valve) insufficiency: Secondary | ICD-10-CM | POA: Diagnosis present

## 2018-08-24 DIAGNOSIS — I4510 Unspecified right bundle-branch block: Secondary | ICD-10-CM | POA: Diagnosis present

## 2018-08-24 DIAGNOSIS — I712 Thoracic aortic aneurysm, without rupture: Secondary | ICD-10-CM | POA: Diagnosis present

## 2018-08-24 DIAGNOSIS — K5900 Constipation, unspecified: Secondary | ICD-10-CM | POA: Diagnosis present

## 2018-08-24 DIAGNOSIS — Z888 Allergy status to other drugs, medicaments and biological substances status: Secondary | ICD-10-CM

## 2018-08-24 DIAGNOSIS — T360X5A Adverse effect of penicillins, initial encounter: Secondary | ICD-10-CM | POA: Diagnosis not present

## 2018-08-24 DIAGNOSIS — I5022 Chronic systolic (congestive) heart failure: Secondary | ICD-10-CM | POA: Diagnosis not present

## 2018-08-24 DIAGNOSIS — K8031 Calculus of bile duct with cholangitis, unspecified, with obstruction: Principal | ICD-10-CM | POA: Diagnosis present

## 2018-08-24 DIAGNOSIS — K8050 Calculus of bile duct without cholangitis or cholecystitis without obstruction: Secondary | ICD-10-CM

## 2018-08-24 DIAGNOSIS — Z800 Family history of malignant neoplasm of digestive organs: Secondary | ICD-10-CM

## 2018-08-24 DIAGNOSIS — Z01818 Encounter for other preprocedural examination: Secondary | ICD-10-CM | POA: Diagnosis not present

## 2018-08-24 DIAGNOSIS — G4733 Obstructive sleep apnea (adult) (pediatric): Secondary | ICD-10-CM | POA: Diagnosis present

## 2018-08-24 DIAGNOSIS — Z8249 Family history of ischemic heart disease and other diseases of the circulatory system: Secondary | ICD-10-CM

## 2018-08-24 DIAGNOSIS — Z7982 Long term (current) use of aspirin: Secondary | ICD-10-CM | POA: Diagnosis not present

## 2018-08-24 DIAGNOSIS — R109 Unspecified abdominal pain: Secondary | ICD-10-CM | POA: Diagnosis present

## 2018-08-24 DIAGNOSIS — Z801 Family history of malignant neoplasm of trachea, bronchus and lung: Secondary | ICD-10-CM

## 2018-08-24 DIAGNOSIS — Z7902 Long term (current) use of antithrombotics/antiplatelets: Secondary | ICD-10-CM

## 2018-08-24 LAB — COMPREHENSIVE METABOLIC PANEL
ALBUMIN: 4.3 g/dL (ref 3.5–5.0)
ALT: 167 U/L — ABNORMAL HIGH (ref 0–44)
AST: 385 U/L — AB (ref 15–41)
Alkaline Phosphatase: 160 U/L — ABNORMAL HIGH (ref 38–126)
Anion gap: 12 (ref 5–15)
BUN: 27 mg/dL — AB (ref 8–23)
CO2: 22 mmol/L (ref 22–32)
Calcium: 9.3 mg/dL (ref 8.9–10.3)
Chloride: 104 mmol/L (ref 98–111)
Creatinine, Ser: 1.76 mg/dL — ABNORMAL HIGH (ref 0.61–1.24)
GFR calc Af Amer: 46 mL/min — ABNORMAL LOW (ref >60)
GFR calc non Af Amer: 39 mL/min — ABNORMAL LOW (ref >60)
Glucose, Bld: 133 mg/dL — ABNORMAL HIGH (ref 70–99)
Potassium: 4.5 mmol/L (ref 3.5–5.1)
SODIUM: 138 mmol/L (ref 135–145)
Total Bilirubin: 3.3 mg/dL — ABNORMAL HIGH (ref 0.3–1.2)
Total Protein: 7.6 g/dL (ref 6.5–8.1)

## 2018-08-24 LAB — CBC
HCT: 51.3 % (ref 39.0–52.0)
Hemoglobin: 16.3 g/dL (ref 13.0–17.0)
MCH: 26.5 pg (ref 26.0–34.0)
MCHC: 31.8 g/dL (ref 30.0–36.0)
MCV: 83.4 fL (ref 80.0–100.0)
Platelets: 168 10*3/uL (ref 150–400)
RBC: 6.15 MIL/uL — ABNORMAL HIGH (ref 4.22–5.81)
RDW: 14 % (ref 11.5–15.5)
WBC: 8.1 10*3/uL (ref 4.0–10.5)
nRBC: 0 % (ref 0.0–0.2)

## 2018-08-24 LAB — I-STAT TROPONIN, ED: Troponin i, poc: 0.01 ng/mL (ref 0.00–0.08)

## 2018-08-24 LAB — LIPASE, BLOOD: Lipase: 50 U/L (ref 11–51)

## 2018-08-24 LAB — PROTIME-INR
INR: 1.06
PROTHROMBIN TIME: 13.7 s (ref 11.4–15.2)

## 2018-08-24 LAB — I-STAT CG4 LACTIC ACID, ED: Lactic Acid, Venous: 2.53 mmol/L (ref 0.5–1.9)

## 2018-08-24 MED ORDER — ASPIRIN EC 81 MG PO TBEC
81.0000 mg | DELAYED_RELEASE_TABLET | Freq: Every day | ORAL | Status: DC
  Administered 2018-08-25 – 2018-08-30 (×6): 81 mg via ORAL
  Filled 2018-08-24 (×6): qty 1

## 2018-08-24 MED ORDER — CARVEDILOL 25 MG PO TABS
25.0000 mg | ORAL_TABLET | Freq: Two times a day (BID) | ORAL | Status: DC
  Administered 2018-08-24 – 2018-08-30 (×10): 25 mg via ORAL
  Filled 2018-08-24 (×10): qty 1
  Filled 2018-08-24: qty 2

## 2018-08-24 MED ORDER — SODIUM CHLORIDE 0.9 % IV SOLN
INTRAVENOUS | Status: DC
  Administered 2018-08-24 – 2018-08-25 (×5): via INTRAVENOUS

## 2018-08-24 MED ORDER — HYDROMORPHONE HCL 1 MG/ML IJ SOLN
0.5000 mg | INTRAMUSCULAR | Status: DC | PRN
  Administered 2018-08-25: 0.5 mg via INTRAVENOUS
  Filled 2018-08-24: qty 1

## 2018-08-24 MED ORDER — AMLODIPINE BESYLATE 5 MG PO TABS
5.0000 mg | ORAL_TABLET | Freq: Every day | ORAL | Status: DC
  Administered 2018-08-25 – 2018-08-27 (×3): 5 mg via ORAL
  Filled 2018-08-24 (×3): qty 1

## 2018-08-24 MED ORDER — ASPIRIN 81 MG PO CHEW
324.0000 mg | CHEWABLE_TABLET | Freq: Once | ORAL | Status: DC
  Filled 2018-08-24: qty 4

## 2018-08-24 MED ORDER — CYCLOSPORINE 0.05 % OP EMUL
1.0000 [drp] | Freq: Every day | OPHTHALMIC | Status: DC | PRN
  Filled 2018-08-24: qty 1

## 2018-08-24 MED ORDER — HYDROMORPHONE HCL 1 MG/ML IJ SOLN
1.0000 mg | Freq: Once | INTRAMUSCULAR | Status: AC
  Administered 2018-08-24: 1 mg via INTRAVENOUS
  Filled 2018-08-24: qty 1

## 2018-08-24 MED ORDER — OMEGA-3-ACID ETHYL ESTERS 1 G PO CAPS
2.0000 g | ORAL_CAPSULE | Freq: Two times a day (BID) | ORAL | Status: DC
  Administered 2018-08-25 – 2018-08-30 (×10): 2 g via ORAL
  Filled 2018-08-24 (×12): qty 2

## 2018-08-24 MED ORDER — SPIRONOLACTONE 25 MG PO TABS
25.0000 mg | ORAL_TABLET | Freq: Every day | ORAL | Status: DC
  Administered 2018-08-25: 25 mg via ORAL
  Filled 2018-08-24 (×2): qty 1

## 2018-08-24 MED ORDER — SODIUM CHLORIDE 0.9 % IV BOLUS
1000.0000 mL | Freq: Once | INTRAVENOUS | Status: AC
  Administered 2018-08-24: 1000 mL via INTRAVENOUS

## 2018-08-24 MED ORDER — ALLOPURINOL 300 MG PO TABS
150.0000 mg | ORAL_TABLET | Freq: Every day | ORAL | Status: DC
  Administered 2018-08-25 – 2018-08-30 (×5): 150 mg via ORAL
  Filled 2018-08-24 (×6): qty 1
  Filled 2018-08-24: qty 0.5

## 2018-08-24 MED ORDER — NITROGLYCERIN 0.4 MG SL SUBL: 0.4000 mg | SUBLINGUAL_TABLET | SUBLINGUAL | Status: DC | PRN

## 2018-08-24 MED ORDER — ONDANSETRON HCL 4 MG/2ML IJ SOLN: 4.0000 mg | Freq: Four times a day (QID) | INTRAMUSCULAR | Status: DC | PRN

## 2018-08-24 MED ORDER — ENOXAPARIN SODIUM 40 MG/0.4ML ~~LOC~~ SOLN
40.0000 mg | Freq: Every day | SUBCUTANEOUS | Status: DC
  Filled 2018-08-24: qty 0.4

## 2018-08-24 MED ORDER — CLOPIDOGREL BISULFATE 75 MG PO TABS: 75.0000 mg | ORAL_TABLET | Freq: Every day | ORAL | Status: DC

## 2018-08-24 MED ORDER — FAMOTIDINE 20 MG PO TABS
10.0000 mg | ORAL_TABLET | Freq: Every day | ORAL | Status: DC
  Administered 2018-08-25 – 2018-08-30 (×6): 10 mg via ORAL
  Filled 2018-08-24 (×6): qty 1

## 2018-08-24 MED ORDER — TURMERIC 500 MG PO CAPS: 500.0000 mg | ORAL_CAPSULE | Freq: Every day | ORAL | Status: DC

## 2018-08-24 MED ORDER — ATORVASTATIN CALCIUM 10 MG PO TABS: 20.0000 mg | ORAL_TABLET | Freq: Every day | ORAL | Status: DC

## 2018-08-24 NOTE — ED Triage Notes (Signed)
To ED via [facility] vehicle-- pt c/o severe abd and chest pain- pt is pale, shaking, c/o constipation x 2 days, worse pain today--started approx 1 hour ago at this severe level.

## 2018-08-24 NOTE — H&P (Signed)
History and Physical  Daniel Hurst ZOX:[id number] DOB: Dec 13, 1950 DOA: 08/24/2018  Referring physician: Sabas Sous, MD PCP: Pearson Grippe, MD  Outpatient Specialists: Patient coming from: Home & is able to ambulate   Chief Complaint:    HPI: Daniel Hurst is a 68 y.o. male with medical history significant for coronary artery disease S/P MI in 2012, hyperlipidemia, gout hypertension, left ventricular dysfunction, right bundle branch block, presents with 2 day history of abdominal pain Reiger nausea and vomiting.  Denies chest pain denies fever cough or diarrhea   ED Course: iv fliud and antibiotic and Dilaudid with improvement of his abdominal pain Review of Systems: Patient seen  Pt complains ofabdominal pain  Pt denies any .  Review of systems are otherwise negative   Past Medical History:  Diagnosis Date  . CAD (coronary artery disease)   . HLD (hyperlipidemia)   . Hypertension   . Left ventricular dysfunction   . MI (myocardial infarction) (HCC)   . Right bundle branch block    Past Surgical History:  Procedure Laterality Date  . CARDIOVASCULAR STRESS TEST  03/19/13   stress nuclear study extensive inferior, inferoseptal and apical scar with little, if any, periinfarct ischemia..  . TRANSTHORACIC ECHOCARDIOGRAM  03/24/2012   ejection fraction 35-45%. Apical akinesis. Severe inferior wall hypokinesis. Severe posterior wall hypokinesis. Supected patent foramen ovale    Social History:  reports that he quit smoking about 37 years ago. He has never used smokeless tobacco. He reports that he does not drink alcohol or use drugs.   Allergies  Allergen Reactions  . Ace Inhibitors Other (See Comments)    cough    Family History  Problem Relation Age of Onset  . Diabetes Mother   . Heart attack Father   . Stroke Father       Prior to Admission medications   Medication Sig Start Date End Date Taking? Authorizing Provider  allopurinol (ZYLOPRIM) 300 MG tablet Take 150 mg by  mouth daily.   Yes [provider]  amLODipine (NORVASC) 5 MG tablet TAKE 1 TABLET BY MOUTH  DAILY Patient taking differently: Take 5 mg by mouth daily.  07/16/18  Yes [name], MD  aspirin 81 MG tablet Take 1 tablet (81 mg total) by mouth daily. 11/15/17  Yes Laurey Morale, MD  atorvastatin (LIPITOR) 40 MG tablet TAKE 1 TABLET BY MOUTH  DAILY AT 6 PM. Patient taking differently: Take by mouth daily at 6 PM.  01/23/18  Yes [name], MD  carvedilol (COREG) 25 MG tablet TAKE 1 TABLET BY MOUTH TWO  TIMES DAILY Patient taking differently: Take 25 mg by mouth 2 (two) times daily.  08/19/17  Yes [name], MD  clopidogrel (PLAVIX) 75 MG tablet TAKE 1 TABLET BY MOUTH  DAILY Patient taking differently: Take 75 mg by mouth daily.  01/08/18  Yes [name], MD  ENTRESTO 49-51 MG TAKE ONE TABLET BY MOUTH TWICE A DAY Patient taking differently: Take 1 tablet by mouth 2 (two) times daily.  03/28/18  Yes [name], MD  Famotidine (PEPCID AC PO) Take 1 tablet by mouth daily.   Yes [provider]  Icosapent Ethyl (VASCEPA) 1 g CAPS Take 2 tablets by mouth 2 (two) times daily.   Yes [provider]  nitroGLYCERIN (NITROSTAT) 0.4 MG SL tablet Place 1 tablet (0.4 mg total) under the tongue every 5 (five) minutes as needed. 07/18/18  Yes Laurey Morale, MD  RESTASIS 0.05 %  ophthalmic emulsion Place 1 drop into both eyes daily as needed (dryness).  06/12/14  Yes [provider]  spironolactone (ALDACTONE) 25 MG tablet TAKE ONE TABLET BY MOUTH DAILY Patient taking differently: Take 25 mg by mouth daily.  08/21/18  Yes Laurey Morale, MD  TURMERIC PO Take 1 tablet by mouth daily.   Yes [provider]    Physical Exam: BP 137/88   Pulse (!) 103   Temp 97.6 F (36.4 C) (Oral)   Resp (!) 33   Ht 5' 6.5" (1.689 m)   Wt 77.6 kg   SpO2 92%   BMI 27.19 kg/m   Exam:  . General: 68 y.o. year-old male well developed well  nourished in no acute distress.  Alert and oriented x3. . Cardiovascular: Regular rate and rhythm with no rubs or gallops.  No thyromegaly or JVD noted.   Marland Kitchen Respiratory: Clear to auscultation with no wheezes or rales. Good inspiratory effort. . Abdomen: Soft nontender nondistended with normal bowel sounds x4 quadrants. . Musculoskeletal: No lower extremity edema. 2/4 pulses in all 4 extremities. . Skin: No ulcerative lesions noted or rashes, no jaundice . Psychiatry: Mood is appropriate for condition and setting           Labs on Admission:  Basic Metabolic Panel: Recent Labs  Lab 08/24/18 1530  NA 138  K 4.5  CL 104  CO2 22  GLUCOSE 133*  BUN 27*  CREATININE 1.76*  CALCIUM 9.3   Liver Function Tests: Recent Labs  Lab 08/24/18 1530  AST 385*  ALT 167*  ALKPHOS 160*  BILITOT 3.3*  PROT 7.6  ALBUMIN 4.3   Recent Labs  Lab 08/24/18 1530  LIPASE 50   No results for input(s): AMMONIA in the last 168 hours. CBC: Recent Labs  Lab 08/24/18 1530  WBC 8.1  HGB 16.3  HCT 51.3  MCV 83.4  PLT 168   Cardiac Enzymes: No results for input(s): CKTOTAL, CKMB, CKMBINDEX, TROPONINI in the last 168 hours.  BNP (last 3 results) No results for input(s): BNP in the last 8760 hours.  ProBNP (last 3 results) No results for input(s): PROBNP in the last 8760 hours.  CBG: No results for input(s): GLUCAP in the last 168 hours.  Radiological Exams on Admission: Ct Abdomen Pelvis Wo Contrast  Result Date: 08/24/2018 CLINICAL DATA:  Epigastric and chest pain EXAM: CT ABDOMEN AND PELVIS WITHOUT CONTRAST TECHNIQUE: Multidetector CT imaging of the abdomen and pelvis was performed following the standard protocol without IV contrast. COMPARISON:  None. FINDINGS: Lower chest: Mild right basilar atelectasis is noted. Hepatobiliary: Liver is within normal limits. Gallbladder is well distended. There are gallstones identified in the region of the gallbladder neck as well as a density noted  in the distal aspect of the common bile duct in the region of the head of the pancreas. This is consistent with a stone and likely the etiology of the patient's underlying discomfort. Common bile duct measures 15 mm. Pancreas: Pancreas is otherwise within normal limits. Spleen: Spleen is unremarkable. Adrenals/Urinary Tract: Adrenal glands are unremarkable. No renal calculi or obstructive changes are seen. A hypodense lesion is noted within the left kidney likely representing a cyst but incompletely evaluated on this exam. This measures 3 cm. No ureteral stones are seen. The bladder is well distended. Stomach/Bowel: The colon is predominately decompressed. The appendix is within normal limits. No small bowel or gastric abnormality is seen. Vascular/Lymphatic: Aortic atherosclerosis. No enlarged abdominal or pelvic lymph nodes.  Reproductive: Prostate is unremarkable. Other: No abdominal wall hernia or abnormality. No abdominopelvic ascites. Musculoskeletal: Degenerative changes of the lumbar spine are seen. IMPRESSION: Evidence of cholelithiasis within the cystic duct as well as the distal common bile duct with prominence of the common bile duct measuring 15 mm. This corresponds to the patient's elevated LFTs and elevated bilirubin and is likely the etiology of the patient's underlying discomfort. Hypodensity within the left kidney likely representing a cyst but incompletely evaluated. Normal-appearing appendix. Electronically Signed   By: Alcide Clever M.D.   On: 08/24/2018 17:27   Dg Chest Port 1 View  Result Date: 08/24/2018 CLINICAL DATA:  68 year old male with severe chest and abdominal pain, shaking and pale. EXAM: PORTABLE CHEST 1 VIEW COMPARISON:  None. FINDINGS: Portable AP semi upright view at 1605 hours. Tortuous thoracic aorta. Cardiac size at the upper limits of normal to mildly enlarged. Other mediastinal contours are within normal limits. Visualized tracheal air column is within normal limits.  Allowing for portable technique the lungs are clear. No acute osseous abnormality identified. IMPRESSION: 1. No acute pulmonary process. 2. Tortuous thoracic aorta with mild cardiomegaly. Electronically Signed   By: Odessa Fleming M.D.   On: 08/24/2018 16:29    EKG: Independently reviewed.  Right bundle branch block which is not new  Assessment/Plan Present on Admission: . Cardiomyopathy, ischemic, EF by Echo 03/24/2012 35-40% . CAD (coronary artery disease):  cardiac catheterization June 2012 because of MI revealed occluded RCA and occluded LAD patent circumflex. Marland Kitchen HTN (hypertension) . HLD (hyperlipidemia) . Abdominal pain . Cholelithiasis with choledocholithiasis . Gout  Principal Problem:   Cholelithiasis with choledocholithiasis Active Problems:   CAD (coronary artery disease):  cardiac catheterization June 2012 because of MI revealed occluded RCA and occluded LAD patent circumflex.   HTN (hypertension)   HLD (hyperlipidemia)   Cardiomyopathy, ischemic, EF by Echo 03/24/2012 35-40%   Abdominal pain   Gout   1.  Choledocholithiasis.  GI has been called for ERCP in the morning  2.  Abdominal pain resolved with IV pain management  3.  History of coronary artery disease stable no chest pain EKG was abnormal but no STEMI and no acute changes troponin was normal  4.  Hypertension uncontrolled restart his home medication and adjust accordingly  5.  Hyperbilirubinemia secondary to cholelithiasis  severity of Illness: The appropriate patient status for this patient is INPATIENT. Inpatient status is judged to be reasonable and necessary in order to provide the required intensity of service to ensure the patient's safety. The patient's presenting symptoms, physical exam findings, and initial radiographic and laboratory data in the context of their chronic comorbidities is felt to place them at high risk for further clinical deterioration. Furthermore, it is not anticipated that the patient will  be medically stable for discharge from the hospital within 2 midnights of admission. The following factors support the patient status of inpatient.   " The patient's presenting symptoms include abdominal pain fever Rigor. " The worrisome physical exam findings include abdominal pain. " The initial radiographic and laboratory data are worrisome because of choledocholithiasis with stone in the cystic and common bile duct. " The chronic co-morbidities include artery disease with MI history.   * I certify that at the point of admission it is my clinical judgment that the patient will require inpatient hospital care spanning beyond 2 midnights from the point of admission due to high intensity of service, high risk for further deterioration and high frequency of surveillance required.*  DVT prophylaxis: Lovenox  Code Status: Full  Family Communication: Wife at bedside  Disposition Plan: When stable  Consults called: GI and cardiology  Admission status: Inpatient    Myrtie NeitherNwannadiya Antwane Grose MD [facility] Pager [phone]  If 7PM-7AM, please contact night-coverage [email or web address] Password [email or web address]  08/24/2018, 8:21 PM

## 2018-08-24 NOTE — Progress Notes (Signed)
PHARMACIST - PHYSICIAN ORDER COMMUNICATION ° °CONCERNING: P&T Medication Policy on Herbal Medications ° °DESCRIPTION:  This patient’s order for:  Turmeric  has been noted. ° °This product(s) is classified as an “herbal” or natural product. °Due to a lack of definitive safety studies or FDA approval, nonstandard manufacturing practices, plus the potential risk of unknown drug-drug interactions while on inpatient medications, the Pharmacy and Therapeutics Committee does not permit the use of “herbal” or natural products of this type within [place]. °  °ACTION TAKEN: °The pharmacy department is unable to verify this order at this time and your patient has been informed of this safety policy. °Please reevaluate patient’s clinical condition at discharge and address if the herbal or natural product(s) should be resumed at that time. ° °

## 2018-08-24 NOTE — ED Provider Notes (Signed)
[facility] Provider Note MRN:  [id number]  Arrival date & time: 08/24/18     Chief Complaint   Chest Pain and Abdominal Pain   History of Present Illness   Daniel Hurst is a 68 y.o. year-old male with a history of CAD, MI presenting to the ED with chief complaint of epigastric pain.  Mild to moderate epigastric pain for 2 days, became severely worse a few hours ago.  Associated with nausea, dizziness, no vomiting, no chest pain or shortness of breath, no lower abdominal pain.  No exacerbating relieving factors.  Denies fever, no cough, no diarrhea.  Review of Systems  A complete 10 system review of systems was obtained and all systems are negative except as noted in the HPI and PMH.   Patient's Health History    Past Medical History:  Diagnosis Date  . CAD (coronary artery disease)   . HLD (hyperlipidemia)   . Hypertension   . Left ventricular dysfunction   . MI (myocardial infarction) (HCC)   . Right bundle branch block     Past Surgical History:  Procedure Laterality Date  . CARDIOVASCULAR STRESS TEST  03/19/13   stress nuclear study extensive inferior, inferoseptal and apical scar with little, if any, periinfarct ischemia..  . TRANSTHORACIC ECHOCARDIOGRAM  03/24/2012   ejection fraction 35-45%. Apical akinesis. Severe inferior wall hypokinesis. Severe posterior wall hypokinesis. Supected patent foramen ovale    Family History  Problem Relation Age of Onset  . Diabetes Mother   . Heart attack Father   . Stroke Father     Social History   Socioeconomic History  . Marital status: Married    Spouse name: Not on file  . Number of children: Not on file  . Years of education: Not on file  . Highest education level: Not on file  Occupational History  . Not on file  Social Needs  . Financial resource strain: Not on file  . Food insecurity:    Worry: Not on file    Inability: Not on file  . Transportation needs:    Medical: Not  on file    Non-medical: Not on file  Tobacco Use  . Smoking status: Former Smoker    Last attempt to quit: 05/12/1981    Years since quitting: 37.3  . Smokeless tobacco: Never Used  Substance and Sexual Activity  . Alcohol use: No  . Drug use: No  . Sexual activity: Not on file  Lifestyle  . Physical activity:    Days per week: Not on file    Minutes per session: Not on file  . Stress: Not on file  Relationships  . Social connections:    Talks on phone: Not on file    Gets together: Not on file    Attends religious service: Not on file    Active member of club or organization: Not on file    Attends meetings of clubs or organizations: Not on file    Relationship status: Not on file  . Intimate partner violence:    Fear of current or ex partner: Not on file    Emotionally abused: Not on file    Physically abused: Not on file    Forced sexual activity: Not on file  Other Topics Concern  . Not on file  Social History Narrative  . Not on file     Physical Exam  Vital Signs and Nursing Notes reviewed Vitals:   08/24/18 1521 08/24/18 1647  BP: (!) 206/156 137/88  Pulse: 76 (!) 103  Resp: 18 (!) 33  Temp: 97.6 F (36.4 C)   SpO2: 98% 92%    CONSTITUTIONAL: Ill-appearing, shaking and in moderate distress due to pain NEURO:  Alert and oriented x 3, no focal deficits EYES:  eyes equal and reactive ENT/NECK:  no LAD, no JVD CARDIO: Regular rate, well-perfused, normal S1 and S2 PULM:  CTAB no wheezing or rhonchi GI/GU:  normal bowel sounds, distended, moderately rigid abdomen with epigastric tenderness. MSK/SPINE:  No gross deformities, no edema SKIN:  no rash, atraumatic PSYCH:  Appropriate speech and behavior  Diagnostic and Interventional Summary    EKG Interpretation  Date/Time:  Sunday August 24 2018 15:19:36 EST Ventricular Rate:  76 PR Interval:    QRS Duration: 169 QT Interval:  429 QTC Calculation: 483 R Axis:   4 Text Interpretation:  Sinus rhythm  Consider left atrial enlargement Right bundle branch block Anterolateral infarct, age indeterminate Confirmed by Kennis CarinaBero,  [phone]([id number]) on 08/24/2018 3:32:14 PM      Labs Reviewed  CBC - Abnormal; Notable for the following components:      Result Value   RBC 6.15 (*)    All other components within normal limits  COMPREHENSIVE METABOLIC PANEL - Abnormal; Notable for the following components:   Glucose, Bld 133 (*)    BUN 27 (*)    Creatinine, Ser 1.76 (*)    AST 385 (*)    ALT 167 (*)    Alkaline Phosphatase 160 (*)    Total Bilirubin 3.3 (*)    GFR calc non Af Amer 39 (*)    GFR calc Af Amer 46 (*)    All other components within normal limits  I-STAT CG4 LACTIC ACID, ED - Abnormal; Notable for the following components:   Lactic Acid, Venous 2.53 (*)    All other components within normal limits  LIPASE, BLOOD  PROTIME-INR  I-STAT TROPONIN, ED    CT ABDOMEN PELVIS WO CONTRAST  Final Result    DG Chest Port 1 View  Final Result      Medications  HYDROmorphone (DILAUDID) injection 1 mg (1 mg Intravenous Given 08/24/18 1537)  sodium chloride 0.9 % bolus 1,000 mL (1,000 mLs Intravenous New Bag/Given 08/24/18 1638)     Procedures Critical Care  ED Course and Medical Decision Making  I have reviewed the triage vital signs and the nursing notes.  Pertinent labs & imaging results that were available during my care of the patient were reviewed by me and considered in my medical decision making (see below for details).  Concern for perforated viscus versus atypical presentation of MI in this 68 year old male with history of CAD, EKG with known right bundle, wandering baseline but question of concordant elevation inferiorly.  Discussed with cardiologist on-call, seems unchanged and not consistent with STEMI criteria.  Patient feeling much better after Dilaudid, labs with elevated LFTs, troponin negative, CT pending.  Troponin negative, labs reveal LFT elevation, mild elevation in T  bili.  CT reveals cholelithiasis, choledocholithiasis, no evidence of cholecystitis, but likely the cause of his pain.  No fever to suggest ascending cholangitis.  Awaiting callback from gastroenterology, admitted to hospital service for further care.  Elmer Sow M. Pilar PlateBero, MD [facility] [email or web address]  Final Clinical Impressions(s) / ED Diagnoses     ICD-10-CM   1. Choledocholithiasis K80.50   2. Epigastric pain R10.13 [facility]    [facility] [place]  [facility]  3. Elevated LFTs R94.5     ED Discharge Orders    None         Sabas Sous, MD 08/24/18 1844

## 2018-08-25 ENCOUNTER — Encounter (HOSPITAL_COMMUNITY)
Admission: EM | Disposition: A | Discharge status: Home / Self Care | Payer: Self-pay | Source: Home / Self Care | Attending: Internal Medicine

## 2018-08-25 ENCOUNTER — Inpatient Hospital Stay (HOSPITAL_COMMUNITY): Payer: 59 | Admitting: Certified Registered Nurse Anesthetist

## 2018-08-25 ENCOUNTER — Inpatient Hospital Stay (HOSPITAL_COMMUNITY): Payer: 59

## 2018-08-25 ENCOUNTER — Encounter (HOSPITAL_COMMUNITY): Payer: Self-pay | Admitting: *Deleted

## 2018-08-25 DIAGNOSIS — I2510 Atherosclerotic heart disease of native coronary artery without angina pectoris: Secondary | ICD-10-CM

## 2018-08-25 HISTORY — PX: ERCP: SHX5425

## 2018-08-25 HISTORY — PX: SPHINCTEROTOMY: SHX5544

## 2018-08-25 HISTORY — PX: BILIARY STENT PLACEMENT: SHX5538

## 2018-08-25 LAB — COMPREHENSIVE METABOLIC PANEL
ALT: 907 U/L — AB (ref 0–44)
AST: 865 U/L — ABNORMAL HIGH (ref 15–41)
Albumin: 3.2 g/dL — ABNORMAL LOW (ref 3.5–5.0)
Alkaline Phosphatase: 201 U/L — ABNORMAL HIGH (ref 38–126)
Anion gap: 12 (ref 5–15)
BUN: 26 mg/dL — ABNORMAL HIGH (ref 8–23)
CO2: 19 mmol/L — ABNORMAL LOW (ref 22–32)
Calcium: 8.1 mg/dL — ABNORMAL LOW (ref 8.9–10.3)
Chloride: 106 mmol/L (ref 98–111)
Creatinine, Ser: 1.97 mg/dL — ABNORMAL HIGH (ref 0.61–1.24)
GFR calc Af Amer: 40 mL/min — ABNORMAL LOW (ref >60)
GFR calc non Af Amer: 34 mL/min — ABNORMAL LOW (ref >60)
Glucose, Bld: 137 mg/dL — ABNORMAL HIGH (ref 70–99)
Potassium: 3.7 mmol/L (ref 3.5–5.1)
Sodium: 137 mmol/L (ref 135–145)
Total Bilirubin: 4.7 mg/dL — ABNORMAL HIGH (ref 0.3–1.2)
Total Protein: 5.8 g/dL — ABNORMAL LOW (ref 6.5–8.1)

## 2018-08-25 LAB — CBC
HCT: 43 % (ref 39.0–52.0)
HCT: 43.8 % (ref 39.0–52.0)
Hemoglobin: 13.6 g/dL (ref 13.0–17.0)
Hemoglobin: 14.1 g/dL (ref 13.0–17.0)
MCH: 25.6 pg — ABNORMAL LOW (ref 26.0–34.0)
MCH: 26.1 pg (ref 26.0–34.0)
MCHC: 31.6 g/dL (ref 30.0–36.0)
MCHC: 32.2 g/dL (ref 30.0–36.0)
MCV: 81 fL (ref 80.0–100.0)
MCV: 81.1 fL (ref 80.0–100.0)
PLATELETS: 112 10*3/uL — AB (ref 150–400)
Platelets: 125 10*3/uL — ABNORMAL LOW (ref 150–400)
RBC: 5.31 MIL/uL (ref 4.22–5.81)
RBC: 5.4 MIL/uL (ref 4.22–5.81)
RDW: 13.7 % (ref 11.5–15.5)
RDW: 13.8 % (ref 11.5–15.5)
WBC: 12.4 10*3/uL — ABNORMAL HIGH (ref 4.0–10.5)
WBC: 10.8 10*3/uL — AB (ref 4.0–10.5)
nRBC: 0 % (ref 0.0–0.2)
nRBC: 0 % (ref 0.0–0.2)

## 2018-08-25 LAB — CREATININE, SERUM
CREATININE: 1.99 mg/dL — AB (ref 0.61–1.24)
GFR calc Af Amer: 39 mL/min — ABNORMAL LOW (ref >60)
GFR, EST NON AFRICAN AMERICAN: 34 mL/min — AB (ref >60)

## 2018-08-25 LAB — HIV ANTIBODY (ROUTINE TESTING W REFLEX): HIV Screen 4th Generation wRfx: NONREACTIVE

## 2018-08-25 SURGERY — ERCP, WITH INTERVENTION IF INDICATED
Anesthesia: General

## 2018-08-25 MED ORDER — CIPROFLOXACIN IN D5W 400 MG/200ML IV SOLN
INTRAVENOUS | Status: AC
  Filled 2018-08-25: qty 200

## 2018-08-25 MED ORDER — EPHEDRINE SULFATE 50 MG/ML IJ SOLN
INTRAMUSCULAR | Status: DC | PRN
  Administered 2018-08-25: 10 mg via INTRAVENOUS
  Administered 2018-08-25: 15 mg via INTRAVENOUS
  Administered 2018-08-25: 10 mg via INTRAVENOUS
  Administered 2018-08-25: 15 mg via INTRAVENOUS

## 2018-08-25 MED ORDER — INDOMETHACIN 50 MG RE SUPP
RECTAL | Status: AC
  Filled 2018-08-25: qty 1

## 2018-08-25 MED ORDER — DEXAMETHASONE SODIUM PHOSPHATE 10 MG/ML IJ SOLN
INTRAMUSCULAR | Status: DC | PRN
  Administered 2018-08-25: 10 mg via INTRAVENOUS

## 2018-08-25 MED ORDER — CIPROFLOXACIN IN D5W 400 MG/200ML IV SOLN
INTRAVENOUS | Status: DC | PRN
  Administered 2018-08-25: 400 mg via INTRAVENOUS

## 2018-08-25 MED ORDER — ONDANSETRON HCL 4 MG/2ML IJ SOLN
INTRAMUSCULAR | Status: DC | PRN
  Administered 2018-08-25: 4 mg via INTRAVENOUS

## 2018-08-25 MED ORDER — SUCCINYLCHOLINE CHLORIDE 20 MG/ML IJ SOLN
INTRAMUSCULAR | Status: DC | PRN
  Administered 2018-08-25: 120 mg via INTRAVENOUS

## 2018-08-25 MED ORDER — SODIUM CHLORIDE 0.9 % IV SOLN: INTRAVENOUS | Status: DC

## 2018-08-25 MED ORDER — GLUCAGON HCL RDNA (DIAGNOSTIC) 1 MG IJ SOLR
INTRAMUSCULAR | Status: AC
  Filled 2018-08-25: qty 1

## 2018-08-25 MED ORDER — PHENYLEPHRINE 40 MCG/ML (10ML) SYRINGE FOR IV PUSH (FOR BLOOD PRESSURE SUPPORT)
PREFILLED_SYRINGE | INTRAVENOUS | Status: DC | PRN
  Administered 2018-08-25: 80 ug via INTRAVENOUS
  Administered 2018-08-25: 40 ug via INTRAVENOUS
  Administered 2018-08-25 (×3): 80 ug via INTRAVENOUS
  Administered 2018-08-25: 200 ug via INTRAVENOUS

## 2018-08-25 MED ORDER — LIDOCAINE HCL (CARDIAC) PF 100 MG/5ML IV SOSY
PREFILLED_SYRINGE | INTRAVENOUS | Status: DC | PRN
  Administered 2018-08-25: 80 mg via INTRAVENOUS

## 2018-08-25 MED ORDER — IOPAMIDOL (ISOVUE-300) INJECTION 61%
INTRAVENOUS | Status: AC
  Filled 2018-08-25: qty 100

## 2018-08-25 MED ORDER — INDOMETHACIN 50 MG RE SUPP
RECTAL | Status: DC | PRN
  Administered 2018-08-25: 100 mg via RECTAL

## 2018-08-25 MED ORDER — IOPAMIDOL (ISOVUE-300) INJECTION 61%
INTRAVENOUS | Status: DC | PRN
  Administered 2018-08-25: 50 mL via INTRAVENOUS

## 2018-08-25 MED ORDER — FENTANYL CITRATE (PF) 100 MCG/2ML IJ SOLN
INTRAMUSCULAR | Status: DC | PRN
  Administered 2018-08-25 (×2): 50 ug via INTRAVENOUS

## 2018-08-25 MED ORDER — SODIUM CHLORIDE 0.9 % IV BOLUS
1000.0000 mL | Freq: Once | INTRAVENOUS | Status: AC
  Administered 2018-08-25: 1000 mL via INTRAVENOUS

## 2018-08-25 MED ORDER — PROPOFOL 10 MG/ML IV BOLUS
INTRAVENOUS | Status: DC | PRN
  Administered 2018-08-25: 120 mg via INTRAVENOUS

## 2018-08-25 MED ORDER — SODIUM CHLORIDE 0.9 % IV SOLN
INTRAVENOUS | Status: DC | PRN
  Administered 2018-08-25: 50 ug/min via INTRAVENOUS

## 2018-08-25 MED ORDER — PIPERACILLIN-TAZOBACTAM 3.375 G IVPB
3.3750 g | Freq: Three times a day (TID) | INTRAVENOUS | Status: DC
  Administered 2018-08-25 – 2018-08-29 (×11): 3.375 g via INTRAVENOUS
  Filled 2018-08-25 (×12): qty 50

## 2018-08-25 NOTE — Plan of Care (Signed)
  Problem: Pain Managment: Goal: General experience of comfort will improve Outcome: Progressing   Problem: Safety: Goal: Ability to remain free from injury will improve Outcome: Progressing   Problem: Skin Integrity: Goal: Risk for impaired skin integrity will decrease Outcome: Progressing   

## 2018-08-25 NOTE — ED Notes (Signed)
Nurse drawing labs. 

## 2018-08-25 NOTE — Consult Note (Signed)
Reason for Consult: Ascending cholangitis Referring Physician: [facility]  Daniel Hurst HPI: This is a 68 year old male with a PMH of CAD on Plavix, s/p MI with an EF of 35-45%, and RBBB admitted for abdominal pain, rigors, nausea, and vomiting.  The patient was noted to have an elevated liver panel consistent with an obstructive etiology.  Further evaluation with a CT scan showed a 15 mm CBD with a distal obstructing stone and a stone in the neck of the gallbladder.  There is no overt finding of any cholecystitis.  He has improved with IV antibiotics and pain medications, however, his WBC has increased up to 12.4 and he had a low grade temp of 100.4 F.  Past Medical History:  Diagnosis Date  . CAD (coronary artery disease)   . HLD (hyperlipidemia)   . Hypertension   . Left ventricular dysfunction   . MI (myocardial infarction) (HCC)   . Right bundle branch block     Past Surgical History:  Procedure Laterality Date  . CARDIOVASCULAR STRESS TEST  03/19/13   stress nuclear study extensive inferior, inferoseptal and apical scar with little, if any, periinfarct ischemia..  . TRANSTHORACIC ECHOCARDIOGRAM  03/24/2012   ejection fraction 35-45%. Apical akinesis. Severe inferior wall hypokinesis. Severe posterior wall hypokinesis. Supected patent foramen ovale    Family History  Problem Relation Age of Onset  . Diabetes Mother   . Heart attack Father   . Stroke Father     Social History:  reports that he quit smoking about 37 years ago. He has never used smokeless tobacco. He reports that he does not drink alcohol or use drugs.  Allergies:  Allergies  Allergen Reactions  . Ace Inhibitors Other (See Comments)    cough    Medications:  Scheduled: . [MAR Hold] allopurinol  150 mg Oral Daily  . [MAR Hold] amLODipine  5 mg Oral Daily  . [MAR Hold] aspirin EC  81 mg Oral Daily  . [MAR Hold] carvedilol  25 mg Oral BID  . [MAR Hold] famotidine  10 mg Oral Daily  . [MAR Hold]  omega-3 acid ethyl esters  2 g Oral BID   Continuous: . sodium chloride 100 mL/hr at 08/25/18 1029  . sodium chloride      Results for orders placed or performed during the hospital encounter of 08/24/18 (from the past 24 hour(s))  HIV antibody (Routine Testing)     Status: None   Collection Time: 08/24/18 11:36 PM  Result Value Ref Range   HIV Screen 4th Generation wRfx Non Reactive Non Reactive  CBC     Status: Abnormal   Collection Time: 08/24/18 11:36 PM  Result Value Ref Range   WBC 10.8 (H) 4.0 - 10.5 K/uL   RBC 5.40 4.22 - 5.81 MIL/uL   Hemoglobin 14.1 13.0 - 17.0 g/dL   HCT 92.4 46.2 - 86.3 %   MCV 81.1 80.0 - 100.0 fL   MCH 26.1 26.0 - 34.0 pg   MCHC 32.2 30.0 - 36.0 g/dL   RDW 81.7 71.1 - 65.7 %   Platelets 125 (L) 150 - 400 K/uL   nRBC 0.0 0.0 - 0.2 %  Creatinine, serum     Status: Abnormal   Collection Time: 08/24/18 11:36 PM  Result Value Ref Range   Creatinine, Ser 1.99 (H) 0.61 - 1.24 mg/dL   GFR calc non Af Amer 34 (L) >60 mL/min   GFR calc Af Amer 39 (L) >60 mL/min  CBC  Status: Abnormal   Collection Time: 08/25/18  4:17 AM  Result Value Ref Range   WBC 12.4 (H) 4.0 - 10.5 K/uL   RBC 5.31 4.22 - 5.81 MIL/uL   Hemoglobin 13.6 13.0 - 17.0 g/dL   HCT 40.943.0 81.139.0 - 91.452.0 %   MCV 81.0 80.0 - 100.0 fL   MCH 25.6 (L) 26.0 - 34.0 pg   MCHC 31.6 30.0 - 36.0 g/dL   RDW 78.213.8 95.611.5 - 21.315.5 %   Platelets 112 (L) 150 - 400 K/uL   nRBC 0.0 0.0 - 0.2 %  Comprehensive metabolic panel     Status: Abnormal   Collection Time: 08/25/18  4:17 AM  Result Value Ref Range   Sodium 137 135 - 145 mmol/L   Potassium 3.7 3.5 - 5.1 mmol/L   Chloride 106 98 - 111 mmol/L   CO2 19 (L) 22 - 32 mmol/L   Glucose, Bld 137 (H) 70 - 99 mg/dL   BUN 26 (H) 8 - 23 mg/dL   Creatinine, Ser 0.861.97 (H) 0.61 - 1.24 mg/dL   Calcium 8.1 (L) 8.9 - 10.3 mg/dL   Total Protein 5.8 (L) 6.5 - 8.1 g/dL   Albumin 3.2 (L) 3.5 - 5.0 g/dL   AST 578865 (H) 15 - 41 U/L   ALT 907 (H) 0 - 44 U/L   Alkaline  Phosphatase 201 (H) 38 - 126 U/L   Total Bilirubin 4.7 (H) 0.3 - 1.2 mg/dL   GFR calc non Af Amer 34 (L) >60 mL/min   GFR calc Af Amer 40 (L) >60 mL/min   Anion gap 12 5 - 15     Ct Abdomen Pelvis Wo Contrast  Result Date: 08/24/2018 CLINICAL DATA:  Epigastric and chest pain EXAM: CT ABDOMEN AND PELVIS WITHOUT CONTRAST TECHNIQUE: Multidetector CT imaging of the abdomen and pelvis was performed following the standard protocol without IV contrast. COMPARISON:  None. FINDINGS: Lower chest: Mild right basilar atelectasis is noted. Hepatobiliary: Liver is within normal limits. Gallbladder is well distended. There are gallstones identified in the region of the gallbladder neck as well as a density noted in the distal aspect of the common bile duct in the region of the head of the pancreas. This is consistent with a stone and likely the etiology of the patient's underlying discomfort. Common bile duct measures 15 mm. Pancreas: Pancreas is otherwise within normal limits. Spleen: Spleen is unremarkable. Adrenals/Urinary Tract: Adrenal glands are unremarkable. No renal calculi or obstructive changes are seen. A hypodense lesion is noted within the left kidney likely representing a cyst but incompletely evaluated on this exam. This measures 3 cm. No ureteral stones are seen. The bladder is well distended. Stomach/Bowel: The colon is predominately decompressed. The appendix is within normal limits. No small bowel or gastric abnormality is seen. Vascular/Lymphatic: Aortic atherosclerosis. No enlarged abdominal or pelvic lymph nodes. Reproductive: Prostate is unremarkable. Other: No abdominal wall hernia or abnormality. No abdominopelvic ascites. Musculoskeletal: Degenerative changes of the lumbar spine are seen. IMPRESSION: Evidence of cholelithiasis within the cystic duct as well as the distal common bile duct with prominence of the common bile duct measuring 15 mm. This corresponds to the patient's elevated LFTs and  elevated bilirubin and is likely the etiology of the patient's underlying discomfort. Hypodensity within the left kidney likely representing a cyst but incompletely evaluated. Normal-appearing appendix. Electronically Signed   By: Alcide CleverMark  Lukens M.D.   On: 08/24/2018 17:27   Dg Chest Port 1 View  Result Date: 08/24/2018 CLINICAL  DATA:  68 year old male with severe chest and abdominal pain, shaking and pale. EXAM: PORTABLE CHEST 1 VIEW COMPARISON:  None. FINDINGS: Portable AP semi upright view at 1605 hours. Tortuous thoracic aorta. Cardiac size at the upper limits of normal to mildly enlarged. Other mediastinal contours are within normal limits. Visualized tracheal air column is within normal limits. Allowing for portable technique the lungs are clear. No acute osseous abnormality identified. IMPRESSION: 1. No acute pulmonary process. 2. Tortuous thoracic aorta with mild cardiomegaly. Electronically Signed   By: Odessa Fleming M.D.   On: 08/24/2018 16:29    ROS:  As stated above in the HPI otherwise negative.  Blood pressure 130/67, pulse 77, temperature 98.5 F (36.9 C), temperature source Oral, resp. rate (!) 28, height 5' 6.5" (1.689 m), weight 77.6 kg, SpO2 93 %.    PE: Gen: NAD, Alert and Oriented, jaundiced HEENT:  [place]/AT, EOMI Neck: Supple, no LAD Lungs: CTA Bilaterally CV: RRR without M/G/R ABM: Soft, NTND, +BS Ext: No C/C/E  Assessment/Plan: 1) Probable ascending cholantitis. 2) Choledocholithiasis. 3) Cholelithiasis.   There is a high probability of an ascending cholangitis.  His last intake of Plavix was yesterday.  I discussed in detail with the patient and his family the necessity of an ERCP with stent placement.  A minimal sphincterotomy will be created and a stent will be placed to temporize his ascending cholangitis.  The risks of bleeding, infection, perforation, and pancreatitis were discussed in detail with the patient and his family.  They consent to undergo the ERCP with stent  placement.  Daniel Hurst D 08/25/2018, 4:14 PM

## 2018-08-25 NOTE — Anesthesia Preprocedure Evaluation (Addendum)
Anesthesia Evaluation  Patient identified by MRN, date of birth, ID band Patient awake    Reviewed: Allergy & Precautions, H&P , NPO status , Patient's Chart, lab work & pertinent test results, reviewed documented beta blocker date and time   Airway Mallampati: II  TM Distance: >3 FB Neck ROM: Full    Dental no notable dental hx. (+) Dental Advisory Given   Pulmonary neg pulmonary ROS, former smoker,    Pulmonary exam normal breath sounds clear to auscultation       Cardiovascular hypertension, Pt. on medications and Pt. on home beta blockers + CAD, + Past MI and +CHF   Rhythm:Regular Rate:Normal     Neuro/Psych negative neurological ROS  negative psych ROS   GI/Hepatic negative GI ROS, Neg liver ROS,   Endo/Other  negative endocrine ROS  Renal/GU negative Renal ROS  negative genitourinary   Musculoskeletal   Abdominal   Peds  Hematology negative hematology ROS (+)   Anesthesia Other Findings   Reproductive/Obstetrics negative OB ROS                            Lab Results  Component Value Date   WBC 12.4 (H) 08/25/2018   HGB 13.6 08/25/2018   HCT 43.0 08/25/2018   MCV 81.0 08/25/2018   PLT 112 (L) 08/25/2018   Lab Results  Component Value Date   CREATININE 1.97 (H) 08/25/2018   BUN 26 (H) 08/25/2018   NA 137 08/25/2018   K 3.7 08/25/2018   CL 106 08/25/2018   CO2 19 (L) 08/25/2018    Anesthesia Physical Anesthesia Plan  ASA: IV  Anesthesia Plan: General   Post-op Pain Management:    Induction: Intravenous  PONV Risk Score and Plan: 3 and Ondansetron, Dexamethasone and Treatment may vary due to age or medical condition  Airway Management Planned: Oral ETT  Additional Equipment:   Intra-op Plan:   Post-operative Plan: Extubation in OR  Informed Consent: I have reviewed the patients History and Physical, chart, labs and discussed the procedure including the risks,  benefits and alternatives for the proposed anesthesia with the patient or authorized representative who has indicated his/her understanding and acceptance.     Dental advisory given  Plan Discussed with: CRNA  Anesthesia Plan Comments:        Anesthesia Quick Evaluation

## 2018-08-25 NOTE — Progress Notes (Signed)
PROGRESS NOTE  Daniel Hurst VQX:[id number] DOB: 1950/12/06 DOA: 08/24/2018 PCP: Pearson Grippe, MD  HPI/Brief Narrative  Daniel Hurst is a 68 y.o. year old male with medical history significant for CAD s/p MI 2012, HLD, Gout, HTN, LV dysfunction, RBBB who presented on 08/24/2018 with abdominal pain and nausea and was admitted for cholelithiasis and choledocholithiasis. Patient states he started experiencing epigastric abdominal pain with nausea over the past couple of days. He states the pain worsens when he takes a deep breath in. He reports associated chills that he describes as shaking that occurs with the pain. He also states he has been experiencing some constipation over the past week. He denies any fever, vomiting, diarrhea, or blood in the stool. He did have a similar episode of pain in December 2019, but states he was not evaluated for his sx's at that time and the pain went away on its own. He denies any CP or SOB.  Hospital Course: In the ED, the patient was found to have elevated LFT's and T bili. CT of the abdomen demonstrated cholelithiasis and choledocholithiasis, but no evidence of cholecystitis. GI was consulted to complete ERCP. His EKG showed the known RBBB and possible new changes, but was discussed with cardiology on call who stated it was not consistent with STEMI criteria. He was started on IV fluids and IV Dilaudid for pain control, which has improved his abdominal pain. He has remained afebrile during the course of his stay and hemodynamically stable. His Cr has been elevated at 1.99 and 1.97 today. His LFT's and total bilirubin are increasingly elevated today from yesterday with AST 865, ALT 907, and total bilirubin of 4.7  Assessment/Plan:  1. Choledocholithiasis with hyperbilirubinemia, abdominal pain- Patient is to undergo ERCP with GI today. He is still afebrile. His T bili is elevated 4.7 today, was 3.3 yesterday. His AST is 865 and ALT are 907 today. Will continue IV fluids.  Surgery was consulted for cholecystectomy. Patient states pain has improved with IV Dilaudid. Will continue to monitor and give pain control and nausea control PRN.   2. Hx of CAD s/p MI in 2012- Stable. Patient continues to be chest pain free. Initially, his EKG in the ED was seen to be abnormal, but per cardiology on call consult there was no evidence of STEMI. He does also have hx of known RBBB. Troponin was negative. Will continue to monitor Telemetry.   3. Systolic HF with Left Ventricular Dysfunction- Stable. His last Echo was on 08/19/18, which demonstrated systolic function to be moderately to severely reduced with estimated EF of 30-35%, diffuse hypokinesis with akinesis of the basal inferior and inferolateral walls. He is CP free. There is no evidence of fluid overload today. Will continue to monitor telemetry. Will continue Carvedilol, but continue to hold his home Aldactone.  4. HTN-Stable. BP was uncontrolled yesterday, but is stable at 112/71 today. Will continue home medication of Amlodipine and Carevdilol.   5. Gout- Stable. Continue home Allopurinol.    6. HLD- Stable. Continue home Atorvastatin.   Cultures:  none  Telemetry: Patient is on Telemetry   DVT prophylaxis:  Lovenox  Consultants: GI, General Surgery   Procedures:  none   Antimicrobials:  Code Status: FULL   Family Communication: Spoke with patient's wife in room and daughter on the phone and updated them on the plan.    Disposition Plan: Anticipate discharge pending improvement after ERCP and possible cholecystectomy.  Subjective Patient states he is still experiencing some epigastric abdominal  pain today, which has improved with IV pain medication. He rates his pain at 2-3/10 today. He denies any nausea at the moment. He denies any vomiting or diarrhea. He denies any CP or SOB.   Objective: Vitals:   08/25/18 0300 08/25/18 0400 08/25/18 0500 08/25/18 0620  BP: 110/71 108/67 109/73 112/71  Pulse: 72  73 73 76  Resp:  14 14 20   Temp:   (S) (!) 100.4 F (38 C) 99.1 F (37.3 C)  TempSrc:   Axillary Oral  SpO2: 97% 96% 97% 95%  Weight:      Height:        Intake/Output Summary (Last 24 hours) at 08/25/2018 1320 Last data filed at 08/25/2018 0900 Gross per 24 hour  Intake 2268.18 ml  Output 600 ml  Net 1668.18 ml   Filed Weights   08/24/18 1522  Weight: 77.6 kg    Exam: Constitutional:normal appearing male Eyes: PERRLA, EOMI, scleral icterus present ENMT: Oropharynx with moist mucous membranes, normal dentition Neck: FROM Cardiovascular: RRR no MRGs, with no peripheral edema, DP and PT pulses are 2+ Respiratory: Normal respiratory effort, clear breath sounds  Abdomen: Soft, mild diffuse tenderness, with no HSM Skin: No rash ulcers, or lesions. Without skin tenting  Neurologic: Grossly no focal neuro deficit. Psychiatric:Appropriate affect, and mood. Mental status AAOx3  Data Reviewed: CBC: Recent Labs  Lab 08/24/18 1530 08/24/18 2336 08/25/18 0417  WBC 8.1 10.8* 12.4*  HGB 16.3 14.1 13.6  HCT 51.3 43.8 43.0  MCV 83.4 81.1 81.0  PLT 168 125* 112*   Basic Metabolic Panel: Recent Labs  Lab 08/24/18 1530 08/24/18 2336 08/25/18 0417  NA 138  --  137  K 4.5  --  3.7  CL 104  --  106  CO2 22  --  19*  GLUCOSE 133*  --  137*  BUN 27*  --  26*  CREATININE 1.76* 1.99* 1.97*  CALCIUM 9.3  --  8.1*   GFR: Estimated Creatinine Clearance: 33.9 mL/min (A) (by C-G formula based on SCr of 1.97 mg/dL (H)). Liver Function Tests: Recent Labs  Lab 08/24/18 1530 08/25/18 0417  AST 385* 865*  ALT 167* 907*  ALKPHOS 160* 201*  BILITOT 3.3* 4.7*  PROT 7.6 5.8*  ALBUMIN 4.3 3.2*   Recent Labs  Lab 08/24/18 1530  LIPASE 50   No results for input(s): AMMONIA in the last 168 hours. Coagulation Profile: Recent Labs  Lab 08/24/18 1530  INR 1.06   Cardiac Enzymes: No results for input(s): CKTOTAL, CKMB, CKMBINDEX, TROPONINI in the last 168 hours. BNP (last 3  results) No results for input(s): PROBNP in the last 8760 hours. HbA1C: No results for input(s): HGBA1C in the last 72 hours. CBG: No results for input(s): GLUCAP in the last 168 hours. Lipid Profile: No results for input(s): CHOL, HDL, LDLCALC, TRIG, CHOLHDL, LDLDIRECT in the last 72 hours. Thyroid Function Tests: No results for input(s): TSH, T4TOTAL, FREET4, T3FREE, THYROIDAB in the last 72 hours. Anemia Panel: No results for input(s): VITAMINB12, FOLATE, FERRITIN, TIBC, IRON, RETICCTPCT in the last 72 hours. Urine analysis: No results found for: COLORURINE, APPEARANCEUR, LABSPEC, PHURINE, GLUCOSEU, HGBUR, BILIRUBINUR, KETONESUR, PROTEINUR, UROBILINOGEN, NITRITE, LEUKOCYTESUR Sepsis Labs: @LABRCNTIP (procalcitonin:4,lacticidven:4)  )No results found for this or any previous visit (from the past 240 hour(s)).    Studies: Ct Abdomen Pelvis Wo Contrast  Result Date: 08/24/2018 CLINICAL DATA:  Epigastric and chest pain EXAM: CT ABDOMEN AND PELVIS WITHOUT CONTRAST TECHNIQUE: Multidetector CT imaging of the abdomen and pelvis  was performed following the standard protocol without IV contrast. COMPARISON:  None. FINDINGS: Lower chest: Mild right basilar atelectasis is noted. Hepatobiliary: Liver is within normal limits. Gallbladder is well distended. There are gallstones identified in the region of the gallbladder neck as well as a density noted in the distal aspect of the common bile duct in the region of the head of the pancreas. This is consistent with a stone and likely the etiology of the patient's underlying discomfort. Common bile duct measures 15 mm. Pancreas: Pancreas is otherwise within normal limits. Spleen: Spleen is unremarkable. Adrenals/Urinary Tract: Adrenal glands are unremarkable. No renal calculi or obstructive changes are seen. A hypodense lesion is noted within the left kidney likely representing a cyst but incompletely evaluated on this exam. This measures 3 cm. No ureteral  stones are seen. The bladder is well distended. Stomach/Bowel: The colon is predominately decompressed. The appendix is within normal limits. No small bowel or gastric abnormality is seen. Vascular/Lymphatic: Aortic atherosclerosis. No enlarged abdominal or pelvic lymph nodes. Reproductive: Prostate is unremarkable. Other: No abdominal wall hernia or abnormality. No abdominopelvic ascites. Musculoskeletal: Degenerative changes of the lumbar spine are seen. IMPRESSION: Evidence of cholelithiasis within the cystic duct as well as the distal common bile duct with prominence of the common bile duct measuring 15 mm. This corresponds to the patient's elevated LFTs and elevated bilirubin and is likely the etiology of the patient's underlying discomfort. Hypodensity within the left kidney likely representing a cyst but incompletely evaluated. Normal-appearing appendix. Electronically Signed   By: Alcide CleverMark  Lukens M.D.   On: 08/24/2018 17:27   Dg Chest Port 1 View  Result Date: 08/24/2018 CLINICAL DATA:  68 year old male with severe chest and abdominal pain, shaking and pale. EXAM: PORTABLE CHEST 1 VIEW COMPARISON:  None. FINDINGS: Portable AP semi upright view at 1605 hours. Tortuous thoracic aorta. Cardiac size at the upper limits of normal to mildly enlarged. Other mediastinal contours are within normal limits. Visualized tracheal air column is within normal limits. Allowing for portable technique the lungs are clear. No acute osseous abnormality identified. IMPRESSION: 1. No acute pulmonary process. 2. Tortuous thoracic aorta with mild cardiomegaly. Electronically Signed   By: Odessa FlemingH  Hall M.D.   On: 08/24/2018 16:29    Scheduled Meds: . allopurinol  150 mg Oral Daily  . amLODipine  5 mg Oral Daily  . aspirin EC  81 mg Oral Daily  . atorvastatin  20 mg Oral q1800  . carvedilol  25 mg Oral BID  . famotidine  10 mg Oral Daily  . omega-3 acid ethyl esters  2 g Oral BID    Continuous Infusions: . sodium chloride 100  mL/hr at 08/25/18 1029     LOS: 1 day   Wilmon Armsatherine Cindie Rajagopalan, PA-S

## 2018-08-25 NOTE — Interval H&P Note (Signed)
History and Physical Interval Note:  08/25/2018 4:13 PM  Daniel Hurst  has presented today for surgery, with the diagnosis of Ascending cholangitis  The various methods of treatment have been discussed with the patient and family. After consideration of risks, benefits and other options for treatment, the patient has consented to  Procedure(s): ENDOSCOPIC RETROGRADE CHOLANGIOPANCREATOGRAPHY (ERCP) (N/A) as a surgical intervention .  The patient's history has been reviewed, patient examined, no change in status, stable for surgery.  I have reviewed the patient's chart and labs.  Questions were answered to the patient's satisfaction.     [name]

## 2018-08-25 NOTE — Op Note (Addendum)
[facility] Patient Name: Daniel Hurst Procedure Date : 08/25/2018 MRN: [id number] Attending MD: Jeani Hawking , MD Date of Birth: 1950-08-14 CSN: [id number] Age: 68 Admit Type: Inpatient Procedure:                ERCP Indications:              Common bile duct stone(s) Providers:                Jeani Hawking, MD, Leslie Dales, RN, Roselie Awkward,                            RN, Crista Elliot Referring MD:              Medicines:                General Anesthesia Complications:            No immediate complications. Estimated Blood Loss:     Estimated blood loss: none. Procedure:                Pre-Anesthesia Assessment:                           - Prior to the procedure, a History and Physical                            was performed, and patient medications and                            allergies were reviewed. The patient's tolerance of                            previous anesthesia was also reviewed. The risks                            and benefits of the procedure and the sedation                            options and risks were discussed with the patient.                            All questions were answered, and informed consent                            was obtained. Prior Anticoagulants: The patient has                            taken Plavix (clopidogrel), last dose was 1 day                            prior to procedure. ASA Grade Assessment: III - A                            patient with severe systemic disease. After  reviewing the risks and benefits, the patient was                            deemed in satisfactory condition to undergo the                            procedure.                           - Sedation was administered by an anesthesia                            professional. General anesthesia was attained.                           After obtaining informed consent, the scope was                            passed under  direct vision. Throughout the                            procedure, the patient's blood pressure, pulse, and                            oxygen saturations were monitored continuously. The                            [id number]-Q180V ([id number] Olympus duodenoscope was                            introduced through the mouth, and used to inject                            contrast into and used to cannulate the bile duct.                            The ERCP was accomplished without difficulty. The                            patient tolerated the procedure well. Scope In: Scope Out: Findings:      The major papilla was normal. The bile duct was deeply cannulated with       the short-nosed traction sphincterotome. Contrast was injected. I       personally interpreted the bile duct images. There was brisk flow of       contrast through the ducts. Image quality was excellent. Contrast       extended to the main bile duct. The lower third of the main bile duct       contained one stone, which was 5 mm in diameter. The lower third of the       main bile duct and middle third of the main bile duct were moderately       dilated and segmentally dilated, with a stone causing an obstruction.       The largest diameter was 10 mm. A short 0.035 inch Soft Jagwire was  passed into the biliary tree. A 3 mm biliary sphincterotomy was made       with a monofilament traction (standard) sphincterotome using ERBE       electrocautery. There was no post-sphincterotomy bleeding. One 7 Fr by 5       cm biliary stent with a single external flap and a single internal flap       was placed 4.5 cm into the common bile duct. Clear fluid and pus flowed       through the stent. The stent was in good position.      There was a periampullary diverticulum. Cannulation of the CBD was       performed during the first attempt and with ease, however, the wire did       go into the cystic duct and into the gallbladder. The patient  has a long       and low take off for his cystic duct. This was determined as contrast       only filled the CBD up to the bifurcation and the wire was outside of       this area. The wire was retracted and repositioned with cannulation in       the to the CBD. The guidewire was secured in the left intrahepatic       ducts. The CBD was dilated up to 10 cm, but it was not to the degree       that was reported on the CT scan. A filling defect(s) was (were) noted       in the distal CBD. Because the patient was on Plavix, a small       sphincterotomy was created. This was enough for a 7 Fr x 5 cm stent to       be placed. The desire was to place an 8.5 Fr x 5 cm stent, but this size       stent was not available. Drainage was achieved and it was clear that the       patient had a cholangitis as pus was noted to be escaping from the       stent. In fact, there was evidence of pus from the ampulla during the       initial inspection. Impression:               - The major papilla appeared normal.                           - The middle third of the main bile duct and lower                            third of the main bile duct were moderately                            dilated, with a stone causing an obstruction.                           - Choledocholithiasis was found. Removal by biliary                            sphincterotomy was not accomplished; a stent was  inserted.                           - A biliary sphincterotomy was performed.                           - One biliary stent was placed into the common bile                            duct. Recommendation:           - Return patient to hospital ward for ongoing care.                           - Clear liquid diet.                           - Continue with Zosyn.                           - Continue to hold Plavix.                           - Okay to resume subcutaneous Lovenox.                           - Lap  chole per surgery. Procedure Code(s):        --- Professional ---                           [id number], Endoscopic retrograde                            cholangiopancreatography (ERCP); with placement of                            endoscopic stent into biliary or pancreatic duct,                            including pre- and post-dilation and guide wire                            passage, when performed, including sphincterotomy,                            when performed, each stent                           [id number], Endoscopic catheterization of the biliary                            ductal system, radiological supervision and                            interpretation Diagnosis Code(s):        --- Professional ---  K80.51, Calculus of bile duct without cholangitis                            or cholecystitis with obstruction CPT copyright 2018 [employer]. All rights reserved. The codes documented in this report are preliminary and upon coder review may  be revised to meet current compliance requirements. Jeani HawkingPatrick Ahren Pettinger, MD Jeani HawkingPatrick Naiya Corral, MD 08/25/2018 5:03:17 PM This report has been signed electronically. Number of Addenda: 0

## 2018-08-25 NOTE — Anesthesia Postprocedure Evaluation (Signed)
Anesthesia Post Note  Patient: Daniel Hurst  Procedure(s) Performed: ENDOSCOPIC RETROGRADE CHOLANGIOPANCREATOGRAPHY (ERCP) (N/A ) SPHINCTEROTOMY BILIARY STENT PLACEMENT     Patient location during evaluation: PACU Anesthesia Type: General Level of consciousness: awake and alert Pain management: pain level controlled Vital Signs Assessment: post-procedure vital signs reviewed and stable Respiratory status: spontaneous breathing, nonlabored ventilation, respiratory function stable and patient connected to nasal cannula oxygen Cardiovascular status: blood pressure returned to baseline and stable Postop Assessment: no apparent nausea or vomiting Anesthetic complications: no    Last Vitals:  Vitals:   08/25/18 1735 08/25/18 1756  BP: 124/71 125/78  Pulse: 70 69  Resp: 19 16  Temp:  36.5 C  SpO2: 94% 90%    Last Pain:  Vitals:   08/25/18 1756  TempSrc: Oral  PainSc:                  Kennieth Rad

## 2018-08-25 NOTE — Transfer of Care (Signed)
Immediate Anesthesia Transfer of Care Note  Patient: Daniel Hurst  Procedure(s) Performed: ENDOSCOPIC RETROGRADE CHOLANGIOPANCREATOGRAPHY (ERCP) (N/A ) SPHINCTEROTOMY BILIARY STENT PLACEMENT  Patient Location: Endoscopy Unit  Anesthesia Type:General  Level of Consciousness: drowsy  Airway & Oxygen Therapy: Patient Spontanous Breathing and Patient connected to face mask oxygen  Post-op Assessment: Report given to RN and Post -op Vital signs reviewed and stable  Post vital signs: Reviewed and stable  Last Vitals:  Vitals Value Taken Time  BP 104/63 08/25/2018  5:05 PM  Temp    Pulse 74 08/25/2018  5:06 PM  Resp 19 08/25/2018  5:06 PM  SpO2 96 % 08/25/2018  5:06 PM  Vitals shown include unvalidated device data.  Last Pain:  Vitals:   08/25/18 1453  TempSrc: Oral  PainSc: 3       Patients Stated Pain Goal: 2 (08/25/18 1453)  Complications: No apparent anesthesia complications

## 2018-08-25 NOTE — Progress Notes (Signed)
PROGRESS NOTE    Kartel Wohlwend  HDQ:[id number] DOB: 01-20-1951 DOA: 08/24/2018 PCP: Pearson Grippe, MD    Brief Narrative:  68 year old male who presented with abdominal pain.  He does have significant past medical history for coronary disease, dyslipidemia, hypertension and diastolic heart failure.  Patient reported 2-day history of abdominal pain, nausea and vomiting.  On his initial physical examination blood pressure 137/88, heart rate 100, temperature 97.6, respiratory rate 33, oxygen saturation 92%, he had moist mucous membranes, lungs clear to auscultation bilaterally, heart S1-S2 present and rhythmic, abdomen soft, tender to deep palpation in the epigastrium, no guarding or rebound, no lower extremity edema.  Sodium 138, potassium 4.5, chloride 104, bicarb 22, glucose 133, BUN 27, creatinine 1.76.  His AST was 2085, ALT 167, total bilirubin 3.3, white count 8.1, hemoglobin 16.3, hematocrit 51.3, platelets 168.  CT of the abdomen and pelvis showed cholelithiasis within the cystic duct as well as the distal common bile duct with prominence of the common bile duct measuring 15 mm.  Patient was admitted to the hospital working diagnosis cholelithiasis complicated by choledocholithiasis.   Assessment & Plan:   Principal Problem:   Cholelithiasis with choledocholithiasis Active Problems:   CAD (coronary artery disease):  cardiac catheterization June 2012 because of MI revealed occluded RCA and occluded LAD patent circumflex.   HTN (hypertension)   HLD (hyperlipidemia)   Cardiomyopathy, ischemic, EF by Echo 03/24/2012 35-40%   Abdominal pain   Gout  1.  Cholelithiasis with choledocholithiasis. Patient with worsening hyperbilirubinemia and transaminases, plan for ERCP today, continue pain control and IV fluids. Consulted surgery for cholecystectomy on this admission.   2.  Hypertension. Continue blood pressure control with amlodipine and carvedilol.   3.  Chronic stable systolic heart failure.  EF 35 to 40%, stable with no signs of decompensation, continue gentle IV fluids, continue heart failure management with carvedilol.  4.  Dyslipidemia. Will hold on statin therapy for now due to acute liver injury.   5.  Coronary artery disease. Seems to be stable, patient had recent revascularization. Continue aspirin.   DVT prophylaxis: enoxaparin   Code Status:  full Family Communication: I spoke with patient's family at the bedside and all questions were addressed.  Disposition Plan/ discharge barriers: pending ercp and surgery evaluation   Body mass index is 27.19 kg/m. Malnutrition Type:      Malnutrition Characteristics:      Nutrition Interventions:     RN Pressure Injury Documentation:     Consultants:   Surgery   GI   Procedures:     Antimicrobials:       Subjective: Patient has been NPO, continue to have abdominal pain, mid epigastrium, moderate in intensity, no improving or worsening factors, no radiation.   Objective: Vitals:   08/25/18 0300 08/25/18 0400 08/25/18 0500 08/25/18 0620  BP: 110/71 108/67 109/73 112/71  Pulse: 72 73 73 76  Resp:  14 14 20   Temp:   (S) (!) 100.4 F (38 C) 99.1 F (37.3 C)  TempSrc:   Axillary Oral  SpO2: 97% 96% 97% 95%  Weight:      Height:        Intake/Output Summary (Last 24 hours) at 08/25/2018 1353 Last data filed at 08/25/2018 0900 Gross per 24 hour  Intake 2268.18 ml  Output 600 ml  Net 1668.18 ml   Filed Weights   08/24/18 1522  Weight: 77.6 kg    Examination:   General: Not in pain or dyspnea Neurology: Awake and  alert, non focal  E ENT: positive pallor and  icterus, oral mucosa moist Cardiovascular: No JVD. S1-S2 present, rhythmic, no gallops, rubs, or murmurs. No lower extremity edema. Pulmonary: decreased breath sounds bilaterally at bases, adequate air movement, no wheezing, rhonchi or rales. Gastrointestinal. Abdomen mild distended, tender to deep palpation with no organomegaly,  no rebound or guarding Skin. No rashes Musculoskeletal: no joint deformities     Data Reviewed: I have personally reviewed following labs and imaging studies  CBC: Recent Labs  Lab 08/24/18 1530 08/24/18 2336 08/25/18 0417  WBC 8.1 10.8* 12.4*  HGB 16.3 14.1 13.6  HCT 51.3 43.8 43.0  MCV 83.4 81.1 81.0  PLT 168 125* 112*   Basic Metabolic Panel: Recent Labs  Lab 08/24/18 1530 08/24/18 2336 08/25/18 0417  NA 138  --  137  K 4.5  --  3.7  CL 104  --  106  CO2 22  --  19*  GLUCOSE 133*  --  137*  BUN 27*  --  26*  CREATININE 1.76* 1.99* 1.97*  CALCIUM 9.3  --  8.1*   GFR: Estimated Creatinine Clearance: 33.9 mL/min (A) (by C-G formula based on SCr of 1.97 mg/dL (H)). Liver Function Tests: Recent Labs  Lab 08/24/18 1530 08/25/18 0417  AST 385* 865*  ALT 167* 907*  ALKPHOS 160* 201*  BILITOT 3.3* 4.7*  PROT 7.6 5.8*  ALBUMIN 4.3 3.2*   Recent Labs  Lab 08/24/18 1530  LIPASE 50   No results for input(s): AMMONIA in the last 168 hours. Coagulation Profile: Recent Labs  Lab 08/24/18 1530  INR 1.06   Cardiac Enzymes: No results for input(s): CKTOTAL, CKMB, CKMBINDEX, TROPONINI in the last 168 hours. BNP (last 3 results) No results for input(s): PROBNP in the last 8760 hours. HbA1C: No results for input(s): HGBA1C in the last 72 hours. CBG: No results for input(s): GLUCAP in the last 168 hours. Lipid Profile: No results for input(s): CHOL, HDL, LDLCALC, TRIG, CHOLHDL, LDLDIRECT in the last 72 hours. Thyroid Function Tests: No results for input(s): TSH, T4TOTAL, FREET4, T3FREE, THYROIDAB in the last 72 hours. Anemia Panel: No results for input(s): VITAMINB12, FOLATE, FERRITIN, TIBC, IRON, RETICCTPCT in the last 72 hours.    Radiology Studies: I have reviewed all of the imaging during this hospital visit personally     Scheduled Meds: . allopurinol  150 mg Oral Daily  . amLODipine  5 mg Oral Daily  . aspirin EC  81 mg Oral Daily  .  atorvastatin  20 mg Oral q1800  . carvedilol  25 mg Oral BID  . famotidine  10 mg Oral Daily  . omega-3 acid ethyl esters  2 g Oral BID   Continuous Infusions: . sodium chloride 100 mL/hr at 08/25/18 1029     LOS: 1 day        Coralie KeensMauricio Daniel Teodora Baumgarten, MD [facility] Pager [phone]

## 2018-08-25 NOTE — Consult Note (Signed)
[facility] Surgery Consult Note  Daniel Hurst 08-28-1950  [id number].    Requesting MD: Sander Radon Chief Complaint/Reason for Consult: choledocholithiasis  HPI:  Daniel Hurst is a 68yo male PMH CAD with known occluded RCA and LAD, h/o MI 2012 on Plavix (last dose 1/19), CHF/ischemic cardiomyopathy (EF 30-35% 08/19/2018), CKD-III, HLD, HTN, OSA, gout, RBBB, who presented to [facility] 1/19 with 2 days of epigastric abdominal pain. States that he has had 2 similar attacks in August and December of 2019, but the pain resolved quicker. This time he had a sharp pain in his epigastric region. Pain was constant and severe, worse with inspiration. Denies nausea, vomiting, diarrhea. He did have a temp of 100.5 yesterday.   ED workup included CT abdomen/pelvis which shows cholelithiasis within the cystic duct as well as the distal common bile duct with prominence of the common bile duct measuring 15 mm. Initial lab work WBC 8.1, lactic acid 2.53, creatinine 1.76, lipase 50, AST 385, ALT 167, alk phos 160, Tbili 3.3. Repeat lab work 1/20 revealed rising LFTs with AST 865, ALT 907, alk phos 201, Tbili 4.7.  GI was consulted and Daniel Hurst underwent ERCP yesterday with biliary sphincterotomy and one biliary stent was placed into the common bile duct. General surgery asked to see for consideration of laparoscopic cholecystectomy.  Abdominal surgical history: none Lives at home with his wife Does not use any assistive device for ambulation Walks on the treadmill about 30 minutes a day Employment: desk work  ROS: Review of Systems  Constitutional: Negative.  Negative for chills and fever.  HENT: Negative.   Eyes: Negative.   Respiratory: Positive for shortness of breath.   Cardiovascular: Negative.  Negative for chest pain.  Gastrointestinal: Positive for abdominal pain. Negative for constipation, diarrhea, nausea and vomiting.  Genitourinary: Negative.   Musculoskeletal: Negative.   Skin: Negative.    Neurological: Negative.    All systems reviewed and otherwise negative except for as above  Family History  Problem Relation Age of Onset  . Diabetes Mother   . Heart attack Father   . Stroke Father     Past Medical History:  Diagnosis Date  . CAD (coronary artery disease)   . HLD (hyperlipidemia)   . Hypertension   . Left ventricular dysfunction   . MI (myocardial infarction) ([place])   . Right bundle branch block     Past Surgical History:  Procedure Laterality Date  . CARDIOVASCULAR STRESS TEST  03/19/13   stress nuclear study extensive inferior, inferoseptal and apical scar with little, if any, periinfarct ischemia..  . TRANSTHORACIC ECHOCARDIOGRAM  03/24/2012   ejection fraction 35-45%. Apical akinesis. Severe inferior wall hypokinesis. Severe posterior wall hypokinesis. Supected patent foramen ovale    Social History:  reports that he quit smoking about 37 years ago. He has never used smokeless tobacco. He reports that he does not drink alcohol or use drugs.  Allergies:  Allergies  Allergen Reactions  . Ace Inhibitors Other (See Comments)    cough    Medications Prior to Admission  Medication Sig Dispense Refill  . allopurinol (ZYLOPRIM) 300 MG tablet Take 150 mg by mouth daily.    Marland Kitchen amLODipine (NORVASC) 5 MG tablet TAKE 1 TABLET BY MOUTH  DAILY (Patient taking differently: Take 5 mg by mouth daily. ) 90 tablet 0  . aspirin 81 MG tablet Take 1 tablet (81 mg total) by mouth daily. 30 tablet 3  . atorvastatin (LIPITOR) 40 MG tablet TAKE 1 TABLET BY MOUTH  DAILY AT 6 PM. (Patient taking differently: Take by mouth daily at 6 PM. ) 90 tablet 1  . carvedilol (COREG) 25 MG tablet TAKE 1 TABLET BY MOUTH TWO  TIMES DAILY (Patient taking differently: Take 25 mg by mouth 2 (two) times daily. ) 180 tablet 3  . clopidogrel (PLAVIX) 75 MG tablet TAKE 1 TABLET BY MOUTH  DAILY (Patient taking differently: Take 75 mg by mouth daily. ) 90 tablet 3  . ENTRESTO 49-51 MG TAKE ONE TABLET  BY MOUTH TWICE A DAY (Patient taking differently: Take 1 tablet by mouth 2 (two) times daily. ) 60 tablet 2  . Famotidine (PEPCID AC PO) Take 1 tablet by mouth daily.    Vanessa Kick Ethyl (VASCEPA) 1 g CAPS Take 2 tablets by mouth 2 (two) times daily.    . nitroGLYCERIN (NITROSTAT) 0.4 MG SL tablet Place 1 tablet (0.4 mg total) under the tongue every 5 (five) minutes as needed. 25 tablet 11  . RESTASIS 0.05 % ophthalmic emulsion Place 1 drop into both eyes daily as needed (dryness).     Marland Kitchen spironolactone (ALDACTONE) 25 MG tablet TAKE ONE TABLET BY MOUTH DAILY (Patient taking differently: Take 25 mg by mouth daily. ) 30 tablet 0  . TURMERIC PO Take 1 tablet by mouth daily.      Prior to Admission medications   Medication Sig Start Date End Date Taking? Authorizing Provider  allopurinol (ZYLOPRIM) 300 MG tablet Take 150 mg by mouth daily.   Yes [provider]  amLODipine (NORVASC) 5 MG tablet TAKE 1 TABLET BY MOUTH  DAILY Patient taking differently: Take 5 mg by mouth daily.  07/16/18  Yes Lorretta Harp, MD  aspirin 81 MG tablet Take 1 tablet (81 mg total) by mouth daily. 11/15/17  Yes Larey Dresser, MD  atorvastatin (LIPITOR) 40 MG tablet TAKE 1 TABLET BY MOUTH  DAILY AT 6 PM. Patient taking differently: Take by mouth daily at 6 PM.  01/23/18  Yes Lorretta Harp, MD  carvedilol (COREG) 25 MG tablet TAKE 1 TABLET BY MOUTH TWO  TIMES DAILY Patient taking differently: Take 25 mg by mouth 2 (two) times daily.  08/19/17  Yes Lorretta Harp, MD  clopidogrel (PLAVIX) 75 MG tablet TAKE 1 TABLET BY MOUTH  DAILY Patient taking differently: Take 75 mg by mouth daily.  01/08/18  Yes Lorretta Harp, MD  ENTRESTO 49-51 MG TAKE ONE TABLET BY MOUTH TWICE A DAY Patient taking differently: Take 1 tablet by mouth 2 (two) times daily.  03/28/18  Yes Lorretta Harp, MD  Famotidine (PEPCID AC PO) Take 1 tablet by mouth daily.   Yes [provider]  Icosapent Ethyl (VASCEPA) 1 g CAPS  Take 2 tablets by mouth 2 (two) times daily.   Yes [provider]  nitroGLYCERIN (NITROSTAT) 0.4 MG SL tablet Place 1 tablet (0.4 mg total) under the tongue every 5 (five) minutes as needed. 07/18/18  Yes Larey Dresser, MD  RESTASIS 0.05 % ophthalmic emulsion Place 1 drop into both eyes daily as needed (dryness).  06/12/14  Yes [provider]  spironolactone (ALDACTONE) 25 MG tablet TAKE ONE TABLET BY MOUTH DAILY Patient taking differently: Take 25 mg by mouth daily.  08/21/18  Yes Larey Dresser, MD  TURMERIC PO Take 1 tablet by mouth daily.   Yes [provider]    Blood pressure 130/67, pulse 77, temperature 98.5 F (36.9 C), temperature source Oral, resp. rate (!) 28, height 5'  6.5" (1.689 m), weight 77.6 kg, SpO2 93 %. Physical Exam: General: pleasant, WD/WN male who is laying in bed in NAD HEENT: head is normocephalic, atraumatic.  Sclera are noninjected.  Pupils equal and round.  Ears and nose without any masses or lesions.  Mouth is pink and moist. Dentition fair Heart: regular, rate, and rhythm.  No obvious murmurs, gallops, or rubs noted.  Palpable pedal pulses bilaterally Lungs: CTAB, no wheezes, rhonchi, or rales noted.  Respiratory effort nonlabored Abd: soft, NT/ND, +BS, no masses, hernias, or organomegaly MS: all 4 extremities are symmetrical with no cyanosis, clubbing, or edema. Skin: warm and dry with no masses, lesions, or rashes Psych: A&Ox3 with an appropriate affect. Neuro: cranial nerves grossly intact, extremity CSM intact bilaterally, normal speech   Results for orders placed or performed during the hospital encounter of 08/24/18 (from the past 48 hour(s))  CBC     Status: Abnormal   Collection Time: 08/24/18  3:30 PM  Result Value Ref Range   WBC 8.1 4.0 - 10.5 K/uL   RBC 6.15 (H) 4.22 - 5.81 MIL/uL   Hemoglobin 16.3 13.0 - 17.0 g/dL   HCT 51.3 39.0 - 52.0 %   MCV 83.4 80.0 - 100.0 fL   MCH 26.5 26.0 - 34.0 pg   MCHC 31.8 30.0 -  36.0 g/dL   RDW 14.0 11.5 - 15.5 %   Platelets 168 150 - 400 K/uL   nRBC 0.0 0.0 - 0.2 %    Comment: Performed at [facility], [place] [address] [place], [place] [address]  Comprehensive metabolic panel     Status: Abnormal   Collection Time: 08/24/18  3:30 PM  Result Value Ref Range   Sodium 138 135 - 145 mmol/L   Potassium 4.5 3.5 - 5.1 mmol/L   Chloride 104 98 - 111 mmol/L   CO2 22 22 - 32 mmol/L   Glucose, Bld 133 (H) 70 - 99 mg/dL   BUN 27 (H) 8 - 23 mg/dL   Creatinine, Ser 1.76 (H) 0.61 - 1.24 mg/dL   Calcium 9.3 8.9 - 10.3 mg/dL   Total Protein 7.6 6.5 - 8.1 g/dL   Albumin 4.3 3.5 - 5.0 g/dL   AST 385 (H) 15 - 41 U/L   ALT 167 (H) 0 - 44 U/L   Alkaline Phosphatase 160 (H) 38 - 126 U/L   Total Bilirubin 3.3 (H) 0.3 - 1.2 mg/dL   GFR calc non Af Amer 39 (L) >60 mL/min   GFR calc Af Amer 46 (L) >60 mL/min   Anion gap 12 5 - 15    Comment: Performed at [facility], [place] [address] [place], [place] [address]  Lipase, blood     Status: None   Collection Time: 08/24/18  3:30 PM  Result Value Ref Range   Lipase 50 11 - 51 U/L    Comment: Performed at [place] [address], [place], [place] [address]  Protime-INR     Status: None   Collection Time: 08/24/18  3:30 PM  Result Value Ref Range   Prothrombin Time 13.7 11.4 - 15.2 seconds   INR 1.06     Comment: Performed at [facility], [place] [address] [place], [place] [address]  I-stat troponin, ED     Status: None   Collection Time: 08/24/18  3:32 PM  Result Value Ref Range   Troponin i, poc 0.01 0.00 - 0.08 ng/mL   Comment 3  Comment: Due to the release kinetics of cTnI, a negative result within the first hours of the onset of symptoms does not rule out myocardial infarction with certainty. If myocardial infarction is still suspected, repeat the test at appropriate intervals.   I-Stat CG4 Lactic Acid, ED     Status: Abnormal   Collection Time: 08/24/18  3:35 PM  Result Value Ref  Range   Lactic Acid, Venous 2.53 (HH) 0.5 - 1.9 mmol/L   Comment NOTIFIED PHYSICIAN   HIV antibody (Routine Testing)     Status: None   Collection Time: 08/24/18 11:36 PM  Result Value Ref Range   HIV Screen 4th Generation wRfx Non Reactive Non Reactive    Comment: (NOTE) Performed At: [facility] [place], [place] [id number] Rush Farmer MD [id number]   CBC     Status: Abnormal   Collection Time: 08/24/18 11:36 PM  Result Value Ref Range   WBC 10.8 (H) 4.0 - 10.5 K/uL   RBC 5.40 4.22 - 5.81 MIL/uL   Hemoglobin 14.1 13.0 - 17.0 g/dL   HCT 43.8 39.0 - 52.0 %   MCV 81.1 80.0 - 100.0 fL   MCH 26.1 26.0 - 34.0 pg   MCHC 32.2 30.0 - 36.0 g/dL   RDW 13.7 11.5 - 15.5 %   Platelets 125 (L) 150 - 400 K/uL   nRBC 0.0 0.0 - 0.2 %    Comment: Performed at [facility], [place] [address], [place], [place] [address]  Creatinine, serum     Status: Abnormal   Collection Time: 08/24/18 11:36 PM  Result Value Ref Range   Creatinine, Ser 1.99 (H) 0.61 - 1.24 mg/dL   GFR calc non Af Amer 34 (L) >60 mL/min   GFR calc Af Amer 39 (L) >60 mL/min    Comment: Performed at [place] [address], [place], [place] [address]  CBC     Status: Abnormal   Collection Time: 08/25/18  4:17 AM  Result Value Ref Range   WBC 12.4 (H) 4.0 - 10.5 K/uL   RBC 5.31 4.22 - 5.81 MIL/uL   Hemoglobin 13.6 13.0 - 17.0 g/dL   HCT 43.0 39.0 - 52.0 %   MCV 81.0 80.0 - 100.0 fL   MCH 25.6 (L) 26.0 - 34.0 pg   MCHC 31.6 30.0 - 36.0 g/dL   RDW 13.8 11.5 - 15.5 %   Platelets 112 (L) 150 - 400 K/uL    Comment: REPEATED TO VERIFY PLATELET COUNT CONFIRMED BY SMEAR    nRBC 0.0 0.0 - 0.2 %    Comment: Performed at [facility], [place] [address], [place], [place] [address]  Comprehensive metabolic panel     Status: Abnormal   Collection Time: 08/25/18  4:17 AM  Result Value Ref Range   Sodium 137 135 - 145 mmol/L   Potassium 3.7 3.5 - 5.1 mmol/L   Chloride 106 98 - 111 mmol/L    CO2 19 (L) 22 - 32 mmol/L   Glucose, Bld 137 (H) 70 - 99 mg/dL   BUN 26 (H) 8 - 23 mg/dL   Creatinine, Ser 1.97 (H) 0.61 - 1.24 mg/dL   Calcium 8.1 (L) 8.9 - 10.3 mg/dL   Total Protein 5.8 (L) 6.5 - 8.1 g/dL   Albumin 3.2 (L) 3.5 - 5.0 g/dL   AST 865 (H) 15 - 41 U/L   ALT 907 (H) 0 - 44 U/L   Alkaline Phosphatase 201 (H) 38 -  126 U/L   Total Bilirubin 4.7 (H) 0.3 - 1.2 mg/dL   GFR calc non Af Amer 34 (L) >60 mL/min   GFR calc Af Amer 40 (L) >60 mL/min   Anion gap 12 5 - 15    Comment: Performed at [place] [address], [place], [place] [address]   Ct Abdomen Pelvis Wo Contrast  Result Date: 08/24/2018 CLINICAL DATA:  Epigastric and chest pain EXAM: CT ABDOMEN AND PELVIS WITHOUT CONTRAST TECHNIQUE: Multidetector CT imaging of the abdomen and pelvis was performed following the standard protocol without IV contrast. COMPARISON:  None. FINDINGS: Lower chest: Mild right basilar atelectasis is noted. Hepatobiliary: Liver is within normal limits. Gallbladder is well distended. There are gallstones identified in the region of the gallbladder neck as well as a density noted in the distal aspect of the common bile duct in the region of the head of the pancreas. This is consistent with a stone and likely the etiology of the patient's underlying discomfort. Common bile duct measures 15 mm. Pancreas: Pancreas is otherwise within normal limits. Spleen: Spleen is unremarkable. Adrenals/Urinary Tract: Adrenal glands are unremarkable. No renal calculi or obstructive changes are seen. A hypodense lesion is noted within the left kidney likely representing a cyst but incompletely evaluated on this exam. This measures 3 cm. No ureteral stones are seen. The bladder is well distended. Stomach/Bowel: The colon is predominately decompressed. The appendix is within normal limits. No small bowel or gastric abnormality is seen. Vascular/Lymphatic: Aortic atherosclerosis. No enlarged abdominal or pelvic lymph  nodes. Reproductive: Prostate is unremarkable. Other: No abdominal wall hernia or abnormality. No abdominopelvic ascites. Musculoskeletal: Degenerative changes of the lumbar spine are seen. IMPRESSION: Evidence of cholelithiasis within the cystic duct as well as the distal common bile duct with prominence of the common bile duct measuring 15 mm. This corresponds to the patient's elevated LFTs and elevated bilirubin and is likely the etiology of the patient's underlying discomfort. Hypodensity within the left kidney likely representing a cyst but incompletely evaluated. Normal-appearing appendix. Electronically Signed   By: Inez Catalina M.D.   On: 08/24/2018 17:27   Dg Chest Port 1 View  Result Date: 08/24/2018 CLINICAL DATA:  68 year old male with severe chest and abdominal pain, shaking and pale. EXAM: PORTABLE CHEST 1 VIEW COMPARISON:  None. FINDINGS: Portable AP semi upright view at 1605 hours. Tortuous thoracic aorta. Cardiac size at the upper limits of normal to mildly enlarged. Other mediastinal contours are within normal limits. Visualized tracheal air column is within normal limits. Allowing for portable technique the lungs are clear. No acute osseous abnormality identified. IMPRESSION: 1. No acute pulmonary process. 2. Tortuous thoracic aorta with mild cardiomegaly. Electronically Signed   By: Genevie Ann M.D.   On: 08/24/2018 16:29   Anti-infectives (From admission, onward)   None        Assessment/Plan CAD with known occluded RCA and LAD H/o MI 2012 on Plavix (last dose 1/19) CHF/ischemic cardiomyopathy (EF 30-35% 08/19/2018) CKD-III HLD HTN OSA Gout RBBB  Choledocholithiasis - s/p ERCP 1/20 by Dr. Benson Norway with biliary sphincterotomy and one biliary stent was placed into the common bile duct. Mr. Vilma Meckel is feeling much better today. Likely will require cholecystectomy this admission, but Plavix needs to be out of his system prior to proceeding with surgery. He will also need cardiology  consult for cardiac clearance/risk stratification prior to any procedure. Continue to hold plavix. Trend LFTs. With leukocytosis and temp of 100.4 there is concern for  possible ascending cholangitis, continue IV zosyn.  ID - zosyn 1/20>> VTE - SCDs, lovenox FEN - IVF, CLD Foley - none Follow up - TBD  Wellington Hampshire, [facility] Surgery 08/25/2018, 2:55 PM Pager: [phone] Mon-Thurs 7:00 am-4:30 pm Fri 7:00 am -11:30 AM Sat-Sun 7:00 am-11:30 am

## 2018-08-25 NOTE — Anesthesia Procedure Notes (Signed)
Procedure Name: Intubation Date/Time: 08/25/2018 4:25 PM Performed by: Raenette Rover, CRNA Pre-anesthesia Checklist: Patient identified, Emergency Drugs available, Suction available and Patient being monitored Patient Re-evaluated:Patient Re-evaluated prior to induction Oxygen Delivery Method: Circle system utilized Preoxygenation: Pre-oxygenation with 100% oxygen Induction Type: IV induction Ventilation: Mask ventilation without difficulty Laryngoscope Size: Mac and 4 Grade View: Grade I Tube type: Oral Tube size: 7.5 mm Number of attempts: 1 Airway Equipment and Method: Stylet Placement Confirmation: ETT inserted through vocal cords under direct vision,  positive ETCO2,  CO2 detector and breath sounds checked- equal and bilateral Secured at: 23 cm Tube secured with: Tape Dental Injury: Teeth and Oropharynx as per pre-operative assessment

## 2018-08-25 NOTE — Progress Notes (Signed)
Assisted pt. With setting up his home cpap.

## 2018-08-26 ENCOUNTER — Encounter (HOSPITAL_COMMUNITY): Payer: Self-pay | Admitting: Cardiology

## 2018-08-26 ENCOUNTER — Other Ambulatory Visit: Payer: Self-pay

## 2018-08-26 DIAGNOSIS — Z01818 Encounter for other preprocedural examination: Secondary | ICD-10-CM

## 2018-08-26 LAB — CBC WITH DIFFERENTIAL/PLATELET
Abs Immature Granulocytes: 0 10*3/uL (ref 0.00–0.07)
Basophils Absolute: 0 10*3/uL (ref 0.0–0.1)
Basophils Relative: 0 %
Eosinophils Absolute: 0 10*3/uL (ref 0.0–0.5)
Eosinophils Relative: 0 %
HEMATOCRIT: 42.1 % (ref 39.0–52.0)
Hemoglobin: 13.8 g/dL (ref 13.0–17.0)
LYMPHS ABS: 0.5 10*3/uL — AB (ref 0.7–4.0)
Lymphocytes Relative: 4 %
MCH: 26.2 pg (ref 26.0–34.0)
MCHC: 32.8 g/dL (ref 30.0–36.0)
MCV: 79.9 fL — ABNORMAL LOW (ref 80.0–100.0)
Monocytes Absolute: 0 10*3/uL — ABNORMAL LOW (ref 0.1–1.0)
Monocytes Relative: 0 %
Neutro Abs: 11.1 10*3/uL — ABNORMAL HIGH (ref 1.7–7.7)
Neutrophils Relative %: 96 %
Platelets: 110 10*3/uL — ABNORMAL LOW (ref 150–400)
RBC: 5.27 MIL/uL (ref 4.22–5.81)
RDW: 14.1 % (ref 11.5–15.5)
WBC: 11.6 10*3/uL — ABNORMAL HIGH (ref 4.0–10.5)
nRBC: 0 % (ref 0.0–0.2)
nRBC: 0 /100 WBC

## 2018-08-26 LAB — LIPASE, BLOOD: Lipase: 25 U/L (ref 11–51)

## 2018-08-26 LAB — HEPATIC FUNCTION PANEL
ALT: 532 U/L — AB (ref 0–44)
AST: 242 U/L — ABNORMAL HIGH (ref 15–41)
Albumin: 3 g/dL — ABNORMAL LOW (ref 3.5–5.0)
Alkaline Phosphatase: 163 U/L — ABNORMAL HIGH (ref 38–126)
Bilirubin, Direct: 3.4 mg/dL — ABNORMAL HIGH (ref 0.0–0.2)
Indirect Bilirubin: 1.7 mg/dL — ABNORMAL HIGH (ref 0.3–0.9)
Total Bilirubin: 5.1 mg/dL — ABNORMAL HIGH (ref 0.3–1.2)
Total Protein: 6 g/dL — ABNORMAL LOW (ref 6.5–8.1)

## 2018-08-26 LAB — BASIC METABOLIC PANEL
Anion gap: 9 (ref 5–15)
BUN: 19 mg/dL (ref 8–23)
CO2: 21 mmol/L — ABNORMAL LOW (ref 22–32)
Calcium: 8.2 mg/dL — ABNORMAL LOW (ref 8.9–10.3)
Chloride: 109 mmol/L (ref 98–111)
Creatinine, Ser: 1.73 mg/dL — ABNORMAL HIGH (ref 0.61–1.24)
GFR calc Af Amer: 47 mL/min — ABNORMAL LOW (ref >60)
GFR calc non Af Amer: 40 mL/min — ABNORMAL LOW (ref >60)
Glucose, Bld: 158 mg/dL — ABNORMAL HIGH (ref 70–99)
POTASSIUM: 4.2 mmol/L (ref 3.5–5.1)
Sodium: 139 mmol/L (ref 135–145)

## 2018-08-26 MED ORDER — SPIRONOLACTONE 25 MG PO TABS
25.0000 mg | ORAL_TABLET | Freq: Every day | ORAL | Status: DC
  Administered 2018-08-27 – 2018-08-30 (×4): 25 mg via ORAL
  Filled 2018-08-26 (×4): qty 1

## 2018-08-26 MED ORDER — SACUBITRIL-VALSARTAN 49-51 MG PO TABS
1.0000 | ORAL_TABLET | Freq: Two times a day (BID) | ORAL | Status: DC
  Administered 2018-08-26 – 2018-08-30 (×8): 1 via ORAL
  Filled 2018-08-26 (×8): qty 1

## 2018-08-26 NOTE — Progress Notes (Signed)
Subjective: Feeling better.  He is able to inspire without any pain.  Objective: Vital signs in last 24 hours: Temp:  [97.2 F (36.2 C)-99.4 F (37.4 C)] 97.2 F (36.2 C) (01/21 0510) Pulse Rate:  [58-77] 58 (01/21 0510) Resp:  [16-28] 20 (01/21 0510) BP: (104-130)/(59-78) 107/72 (01/21 0510) SpO2:  [90 %-96 %] 95 % (01/21 0510) Last BM Date: 08/24/18  Intake/Output from previous day: 01/20 0701 - 01/21 0700 In: 1301.4 [P.O.:240; I.V.:1011.4; IV Piggyback:50] Out: 0  Intake/Output this shift: Total I/O In: 61.4 [I.V.:11.4; IV Piggyback:50] Out: -   General appearance: alert and no distress GI: soft, non-tender; bowel sounds normal; no masses,  no organomegaly  Lab Results: Recent Labs    08/24/18 2336 08/25/18 0417 08/26/18 0234  WBC 10.8* 12.4* 11.6*  HGB 14.1 13.6 13.8  HCT 43.8 43.0 42.1  PLT 125* 112* 110*   BMET Recent Labs    08/24/18 1530 08/24/18 2336 08/25/18 0417 08/26/18 0234  NA 138  --  137 139  K 4.5  --  3.7 4.2  CL 104  --  106 109  CO2 22  --  19* 21*  GLUCOSE 133*  --  137* 158*  BUN 27*  --  26* 19  CREATININE 1.76* 1.99* 1.97* 1.73*  CALCIUM 9.3  --  8.1* 8.2*   LFT Recent Labs    08/26/18 0234  PROT 6.0*  ALBUMIN 3.0*  AST 242*  ALT 532*  ALKPHOS 163*  BILITOT 5.1*  BILIDIR 3.4*  IBILI 1.7*   PT/INR Recent Labs    08/24/18 1530  LABPROT 13.7  INR 1.06   Hepatitis Panel No results for input(s): HEPBSAG, HCVAB, HEPAIGM, HEPBIGM in the last 72 hours. C-Diff No results for input(s): CDIFFTOX in the last 72 hours. Fecal Lactopherrin No results for input(s): FECLLACTOFRN in the last 72 hours.  Studies/Results: Ct Abdomen Pelvis Wo Contrast  Result Date: 08/24/2018 CLINICAL DATA:  Epigastric and chest pain EXAM: CT ABDOMEN AND PELVIS WITHOUT CONTRAST TECHNIQUE: Multidetector CT imaging of the abdomen and pelvis was performed following the standard protocol without IV contrast. COMPARISON:  None. FINDINGS: Lower chest:  Mild right basilar atelectasis is noted. Hepatobiliary: Liver is within normal limits. Gallbladder is well distended. There are gallstones identified in the region of the gallbladder neck as well as a density noted in the distal aspect of the common bile duct in the region of the head of the pancreas. This is consistent with a stone and likely the etiology of the patient's underlying discomfort. Common bile duct measures 15 mm. Pancreas: Pancreas is otherwise within normal limits. Spleen: Spleen is unremarkable. Adrenals/Urinary Tract: Adrenal glands are unremarkable. No renal calculi or obstructive changes are seen. A hypodense lesion is noted within the left kidney likely representing a cyst but incompletely evaluated on this exam. This measures 3 cm. No ureteral stones are seen. The bladder is well distended. Stomach/Bowel: The colon is predominately decompressed. The appendix is within normal limits. No small bowel or gastric abnormality is seen. Vascular/Lymphatic: Aortic atherosclerosis. No enlarged abdominal or pelvic lymph nodes. Reproductive: Prostate is unremarkable. Other: No abdominal wall hernia or abnormality. No abdominopelvic ascites. Musculoskeletal: Degenerative changes of the lumbar spine are seen. IMPRESSION: Evidence of cholelithiasis within the cystic duct as well as the distal common bile duct with prominence of the common bile duct measuring 15 mm. This corresponds to the patient's elevated LFTs and elevated bilirubin and is likely the etiology of the patient's underlying discomfort. Hypodensity within the  left kidney likely representing a cyst but incompletely evaluated. Normal-appearing appendix. Electronically Signed   By: Alcide CleverMark  Lukens M.D.   On: 08/24/2018 17:27   Dg Chest Port 1 View  Result Date: 08/24/2018 CLINICAL DATA:  68 year old male with severe chest and abdominal pain, shaking and pale. EXAM: PORTABLE CHEST 1 VIEW COMPARISON:  None. FINDINGS: Portable AP semi upright view at  1605 hours. Tortuous thoracic aorta. Cardiac size at the upper limits of normal to mildly enlarged. Other mediastinal contours are within normal limits. Visualized tracheal air column is within normal limits. Allowing for portable technique the lungs are clear. No acute osseous abnormality identified. IMPRESSION: 1. No acute pulmonary process. 2. Tortuous thoracic aorta with mild cardiomegaly. Electronically Signed   By: Odessa FlemingH  Hall M.D.   On: 08/24/2018 16:29   Dg Ercp Biliary & Pancreatic Ducts  Result Date: 08/25/2018 CLINICAL DATA:  Cholelithiasis EXAM: ERCP TECHNIQUE: Multiple spot images obtained with the fluoroscopic device and submitted for interpretation post-procedure. FLUOROSCOPY TIME:  Fluoroscopy Time:  1 minutes and 45 seconds Radiation Exposure Index (if provided by the fluoroscopic device): Number of Acquired Spot Images: 0 COMPARISON:  None. FINDINGS: Images document cannulation of the common bile duct and contrast filling the biliary tree. A temporary stent has been placed across the ampulla into the common bile duct. IMPRESSION: See above. These images were submitted for radiologic interpretation only. Please see the procedural report for the amount of contrast and the fluoroscopy time utilized. Electronically Signed   By: Jolaine ClickArthur  Hoss M.D.   On: 08/25/2018 17:12    Medications:  Scheduled: . allopurinol  150 mg Oral Daily  . amLODipine  5 mg Oral Daily  . aspirin EC  81 mg Oral Daily  . carvedilol  25 mg Oral BID  . famotidine  10 mg Oral Daily  . omega-3 acid ethyl esters  2 g Oral BID   Continuous: . sodium chloride 100 mL/hr at 08/26/18 0300  . piperacillin-tazobactam (ZOSYN)  IV 3.375 g (08/26/18 0516)    Assessment/Plan: 1) Ascending cholangitis s/p biliary stent placement. 2) Cholelithiasis.   The patient has improved.  His WBC has mildly improved and he is afebrile.  Clinically he is feeling better.  His liver enzymes have significantly declined.  The TB is the last to  improve.  There are no post-ERCP complications.  Plan: 1) Continue with antibiotics. 2) Lap chole per surgery. 3) Upon discharge, he will follow up in the office to schedule a stent removal off of Plavix. 4) Signing off.  LOS: 2 days   Eliot Bencivenga D 08/26/2018, 6:43 AM

## 2018-08-26 NOTE — Progress Notes (Signed)
PROGRESS NOTE  Daniel Hurst WUJ:[id number] DOB: [birth year] DOA: 08/24/2018 PCP: Pearson GrippeKim, James, MD  HPI/Brief Narrative  Daniel Hurst is a 68 y.o. year old male with medical history significant for CAD (occluded RCA and LAD) s/p MI 2012, HLD, Gout, HTN, Systolic CHF (last EF 30-35%), RBBB who presented on 08/24/2018 with abdominal pain and nausea and was admitted for cholelithiasis and choledocholithiasis. Patient states he started experiencing sharp epigastric abdominal pain with nausea over the past couple of days. He states the pain worsens when he takes a deep breath in. He reports associated chills that he describes as shaking that occurs with the pain. He also states he has been experiencing some constipation over the past week. He denies any fever, vomiting, diarrhea, or blood in the stool. He did have a similar episode of pain in December 2019, but states he was not evaluated for his sx's at that time and the pain resolved on its own. He denies any CP or SOB.  Hospital Course: In the ED, the patient was found to have elevated LFT's and T bili. CT of the abdomen demonstrated cholelithiasis and choledocholithiasis, but no evidence of cholecystitis. GI was consulted to complete ERCP. He was started on IV fluids and IV Dilaudid for pain control, which improved his abdominal pain.  Prior to his ERCP yesterday, his LFT's and total bilirubin were increasingly elevated with AST 865, ALT 907, and total bilirubin of 4.7. There was concern for ascending cholangitis due the fact that the patient had an elevated white count at 12.4 and was febrile at 100.4 yesterday at one point. Patient underwent ERCP with biliary sphincterotomy and had biliary stent placed in the common bile duct yesterday with GI. Surgery has been consulted for possible lap cholecystectomy to be done once patient has cardiac clearance and Plavix is out of his system.  Assessment/Plan:  1. Choledocholithiasiss/p ERCP with biliary stent placement in  CBD 08/25/18, Complicated with Ascending Cholangitis- There was concern for ascending cholangitis yesterday due to increase in white count to 12.4 and elevated temp at 100.4 F. He was started on IV Zosyn. Patient underwent ERCP with biliary sphincterotomy and biliary stent placement in the common bile duct with GI yesterday. They are recommending continuing his IV Zosyn, holding his Plavix, resuming subQ Lovenox, clear liquid diet, and follow-up with GI in office upon discharge to schedule stent removal. His abdominal pain is improved today after the procedure and his white count has improved to 11.6 today. He is afebrile. His LFT's have improved since yesterday with AST at 242 today and ALT at 532 today. His T bili remains elevated at 5.1. Surgery has evaluated the patient and patient will likely require cholecystectomy during this admisison. Their plan is to continue holding Plavix, monitor LFT's, and continue IV Zosyn at this time. His last Plavix dose was on Sunday 08/24/18. Per surgery, patient will need cardiology consult for cardiac clearance/risk stratification prior to procedure.   2. Hx of CAD s/p MI in 2012- Stable. Patient continues to be chest pain free. Initially, his EKG in the ED was seen to be abnormal, but per cardiology on call consult there was no evidence of STEMI. He does also have hx of known RBBB. Troponin was negative. Will continue to monitor Telemetry.   3. Chronic Systolic HF- Stable. His last Echo was on 08/19/18, which demonstrated systolic function to be moderately to severely reduced with estimated EF of 30-35%, diffuse hypokinesis with akinesis of the basal inferior and inferolateral walls. He  is CP free. There is no evidence of fluid overload today. Will continue to monitor telemetry. Will continue Carvedilol, but continue to hold his home Aldactone.  4. HTN-Stable. BP was uncontrolled yesterday, but is stable at 112/71 today. Will continue home medication of Amlodipine and  Carevdilol.   5. Gout- Stable. Continue home Allopurinol.    6. HLD- Stable. Continue home Atorvastatin.   Cultures:  none  Telemetry: Patient is on Telemetry  DVT prophylaxis: Lovenox  Consultants: GI, Surgery, Cardiology  Procedures:  ERCP w/ biliary sphincterotomy and biliary stent placement performed 08/25/17  Antimicrobials: Patient is on IV Zosyn  Code Status: FULL   Family Communication: Patient's wife and daughter in room and updated on plan.   Disposition Plan: Anticipate patient with be undergoing lap cholecystectomy with surgery once Plavix is out of his system.    Subjective Patient states he is feeling much better this morning after undergoing ERCP w/ biliary stent placement yesterday with GI. He states his abdominal pain has improved. He is currently on a clear liquid diet per GI. He denies any nausea or vomiting. He denies any CP or SOB. He denies any fever or chills.   Objective: Vitals:   08/25/18 1735 08/25/18 1756 08/25/18 2013 08/26/18 0510  BP: 124/71 125/78 129/77 107/72  Pulse: 70 69 66 (!) 58  Resp: 19 16 18 20   Temp:  97.7 F (36.5 C) 97.8 F (36.6 C) (!) 97.2 F (36.2 C)  TempSrc:  Oral Oral Oral  SpO2: 94% 90% 94% 95%  Weight:      Height:        Intake/Output Summary (Last 24 hours) at 08/26/2018 [id number] Last data filed at 08/26/2018 0300 Gross per 24 hour  Intake 1301.4 ml  Output 0 ml  Net 1301.4 ml   Filed Weights   08/24/18 1522  Weight: 77.6 kg    Exam:  Constitutional:normal appearing male Eyes: EOMI, anicteric, normal conjunctivae ENMT: Oropharynx with moist mucous membranes, normal dentition Neck: FROM Cardiovascular: RRR no MRGs, with no peripheral edema, DP and PT pulses 2+ Respiratory: Normal respiratory effort, clear breath sounds  Abdomen: Soft, non-tender, bowel sounds present, with no HSM Skin: No rash ulcers, or lesions. Without skin tenting  Neurologic: Grossly no focal neuro deficit. Psychiatric:  Appropriate affect, and mood. Mental status AAOx3  Data Reviewed: CBC: Recent Labs  Lab 08/24/18 1530 08/24/18 2336 08/25/18 0417 08/26/18 0234  WBC 8.1 10.8* 12.4* 11.6*  NEUTROABS  --   --   --  11.1*  HGB 16.3 14.1 13.6 13.8  HCT 51.3 43.8 43.0 42.1  MCV 83.4 81.1 81.0 79.9*  PLT 168 125* 112* 110*   Basic Metabolic Panel: Recent Labs  Lab 08/24/18 1530 08/24/18 2336 08/25/18 0417 08/26/18 0234  NA 138  --  137 139  K 4.5  --  3.7 4.2  CL 104  --  106 109  CO2 22  --  19* 21*  GLUCOSE 133*  --  137* 158*  BUN 27*  --  26* 19  CREATININE 1.76* 1.99* 1.97* 1.73*  CALCIUM 9.3  --  8.1* 8.2*   GFR: Estimated Creatinine Clearance: 38.6 mL/min (A) (by C-G formula based on SCr of 1.73 mg/dL (H)). Liver Function Tests: Recent Labs  Lab 08/24/18 1530 08/25/18 0417 08/26/18 0234  AST 385* 865* 242*  ALT 167* 907* 532*  ALKPHOS 160* 201* 163*  BILITOT 3.3* 4.7* 5.1*  PROT 7.6 5.8* 6.0*  ALBUMIN 4.3 3.2* 3.0*   Recent Labs  Lab 08/24/18 1530 08/26/18 0234  LIPASE 50 25   No results for input(s): AMMONIA in the last 168 hours. Coagulation Profile: Recent Labs  Lab 08/24/18 1530  INR 1.06   Cardiac Enzymes: No results for input(s): CKTOTAL, CKMB, CKMBINDEX, TROPONINI in the last 168 hours. BNP (last 3 results) No results for input(s): PROBNP in the last 8760 hours. HbA1C: No results for input(s): HGBA1C in the last 72 hours. CBG: No results for input(s): GLUCAP in the last 168 hours. Lipid Profile: No results for input(s): CHOL, HDL, LDLCALC, TRIG, CHOLHDL, LDLDIRECT in the last 72 hours. Thyroid Function Tests: No results for input(s): TSH, T4TOTAL, FREET4, T3FREE, THYROIDAB in the last 72 hours. Anemia Panel: No results for input(s): VITAMINB12, FOLATE, FERRITIN, TIBC, IRON, RETICCTPCT in the last 72 hours. Urine analysis: No results found for: COLORURINE, APPEARANCEUR, LABSPEC, PHURINE, GLUCOSEU, HGBUR, BILIRUBINUR, KETONESUR, PROTEINUR,  UROBILINOGEN, NITRITE, LEUKOCYTESUR Sepsis Labs: @LABRCNTIP (procalcitonin:4,lacticidven:4)  )No results found for this or any previous visit (from the past 240 hour(s)).    Studies: Dg Ercp Biliary & Pancreatic Ducts  Result Date: 08/25/2018 CLINICAL DATA:  Cholelithiasis EXAM: ERCP TECHNIQUE: Multiple spot images obtained with the fluoroscopic device and submitted for interpretation post-procedure. FLUOROSCOPY TIME:  Fluoroscopy Time:  1 minutes and 45 seconds Radiation Exposure Index (if provided by the fluoroscopic device): Number of Acquired Spot Images: 0 COMPARISON:  None. FINDINGS: Images document cannulation of the common bile duct and contrast filling the biliary tree. A temporary stent has been placed across the ampulla into the common bile duct. IMPRESSION: See above. These images were submitted for radiologic interpretation only. Please see the procedural report for the amount of contrast and the fluoroscopy time utilized. Electronically Signed   By: Jolaine Click M.D.   On: 08/25/2018 17:12    Scheduled Meds: . allopurinol  150 mg Oral Daily  . amLODipine  5 mg Oral Daily  . aspirin EC  81 mg Oral Daily  . carvedilol  25 mg Oral BID  . famotidine  10 mg Oral Daily  . omega-3 acid ethyl esters  2 g Oral BID    Continuous Infusions: . sodium chloride 100 mL/hr at 08/26/18 0300  . piperacillin-tazobactam (ZOSYN)  IV 3.375 g (08/26/18 0516)     LOS: 2 days    Wilmon Arms, PA-S

## 2018-08-26 NOTE — Consult Note (Addendum)
Cardiology Consultation:   Patient ID: Daniel Hurst MRN: [id number]; DOB: 26-Jul-1951  Admit date: 08/24/2018 Date of Consult: 08/26/2018  Primary Care Provider: Pearson Grippe, MD Primary Cardiologist: Nanetta Batty, MD  Advanced heart failure specialist:  Dr. Marca Ancona Primary Electrophysiologist:  None    Patient Profile:   Daniel Hurst is a 68 y.o. male with a hx of CAD, chronic systolic CHF/ischemic cardiomyopathy, CKD stage III, OSA on CPAP, hyperlipidemia, mild aortic insufficiency, ascending aortic aneurysm who is being seen today for preoperative evaluation at the request of Dr. Ella Jubilee.  History of Present Illness:   Mr. Orvis has a history of CAD and chronic systolic CHF/ischemic cardiomyopathy.  He is followed by Dr. Allyson Sabal for cardiology and also is seen in the advanced heart failure clinic by Dr. Shirlee Latch for CHF.  The patient had an MI in 2012 at which time cath showed EF 25% with occluded LAD and RCA, no intervention done.  Since then EF has been low.  Echo in 09/2017 showed EF 30-35% with regional wall motion abnormalities.  Echo in 03/2018 showed EF 35-40% with wall motion abnormalities.  He is medically treated for CHF with Entresto, carvedilol and Spironolactone.  He is treated with aspirin, Plavix and statin for CAD.  The patient was last seen in the office on 07/18/2018 by Dr. Shirlee Latch.  His volume status was stable and Lasix was discontinued, especially with elevated creatinine.  Dr. Shirlee Latch noted that the patient is borderline for an ICD, just above range by echo.  He had planned to do an MRI for more accurate quantification of EF.  With right bundle branch block, 160 ms he consider doing CRT with his ICD.  The patient presented to [facility] emergency department on 08/24/2018 with complaints of right upper quadrant sharp and persistent pain.  CT showed gallstones in gallbladder, cystic duct and distal CBD.  LFTs were initially elevated, trending down.  He underwent ERCP yesterday and  had stent placed in CBD.  Cardiology is asked to evaluate the patient prior to laparoscopic cholecystectomy.  Plavix is currently on hold for planned surgery with 5-day washout.  Upon my examination the patient tells me that he lives with his wife. He mostly sits working on the computer all day. He has bought a treadmill and has been trying to use it, he says 30 minutes about 5 days per week, but his daughters says it is more like 2-3 times per week. When he does use treadmill he has no exertional chest discomfort or dyspnea. He says that he is doing well with no fluid overload recently. Since his lasix was dc'd in December his wt has been stable at home, ~172 lbs.   He was referred to Dr Marisue Humble for nephrology in December and his renal function is being monitored.   He is a remote smoker having quit about 35 years ago. He does not drink alcohol. He is fully compliant with his medications.   Past Medical History:  Diagnosis Date  . CAD (coronary artery disease)   . HLD (hyperlipidemia)   . Hypertension   . Left ventricular dysfunction   . MI (myocardial infarction) (HCC)   . Right bundle branch block     Past Surgical History:  Procedure Laterality Date  . CARDIOVASCULAR STRESS TEST  03/19/13   stress nuclear study extensive inferior, inferoseptal and apical scar with little, if any, periinfarct ischemia..  . TRANSTHORACIC ECHOCARDIOGRAM  03/24/2012   ejection fraction 35-45%. Apical akinesis. Severe inferior wall hypokinesis. Severe  posterior wall hypokinesis. Supected patent foramen ovale     Home Medications:  Prior to Admission medications   Medication Sig Start Date End Date Taking? Authorizing Provider  allopurinol (ZYLOPRIM) 300 MG tablet Take 150 mg by mouth daily.   Yes [provider]  amLODipine (NORVASC) 5 MG tablet TAKE 1 TABLET BY MOUTH  DAILY Patient taking differently: Take 5 mg by mouth daily.  07/16/18  Yes Runell GessBerry, Jonathan J, MD  aspirin 81 MG tablet Take 1  tablet (81 mg total) by mouth daily. 11/15/17  Yes Laurey MoraleMcLean, Dalton S, MD  atorvastatin (LIPITOR) 40 MG tablet TAKE 1 TABLET BY MOUTH  DAILY AT 6 PM. Patient taking differently: Take by mouth daily at 6 PM.  01/23/18  Yes Runell GessBerry, Jonathan J, MD  carvedilol (COREG) 25 MG tablet TAKE 1 TABLET BY MOUTH TWO  TIMES DAILY Patient taking differently: Take 25 mg by mouth 2 (two) times daily.  08/19/17  Yes Runell GessBerry, Jonathan J, MD  clopidogrel (PLAVIX) 75 MG tablet TAKE 1 TABLET BY MOUTH  DAILY Patient taking differently: Take 75 mg by mouth daily.  01/08/18  Yes Runell GessBerry, Jonathan J, MD  ENTRESTO 49-51 MG TAKE ONE TABLET BY MOUTH TWICE A DAY Patient taking differently: Take 1 tablet by mouth 2 (two) times daily.  03/28/18  Yes Runell GessBerry, Jonathan J, MD  Famotidine (PEPCID AC PO) Take 1 tablet by mouth daily.   Yes [provider]  Icosapent Ethyl (VASCEPA) 1 g CAPS Take 2 tablets by mouth 2 (two) times daily.   Yes [provider]  nitroGLYCERIN (NITROSTAT) 0.4 MG SL tablet Place 1 tablet (0.4 mg total) under the tongue every 5 (five) minutes as needed. 07/18/18  Yes Laurey MoraleMcLean, Dalton S, MD  RESTASIS 0.05 % ophthalmic emulsion Place 1 drop into both eyes daily as needed (dryness).  06/12/14  Yes [provider]  spironolactone (ALDACTONE) 25 MG tablet TAKE ONE TABLET BY MOUTH DAILY Patient taking differently: Take 25 mg by mouth daily.  08/21/18  Yes Laurey MoraleMcLean, Dalton S, MD  TURMERIC PO Take 1 tablet by mouth daily.   Yes [provider]    Inpatient Medications: Scheduled Meds: . allopurinol  150 mg Oral Daily  . amLODipine  5 mg Oral Daily  . aspirin EC  81 mg Oral Daily  . carvedilol  25 mg Oral BID  . famotidine  10 mg Oral Daily  . omega-3 acid ethyl esters  2 g Oral BID   Continuous Infusions: . sodium chloride Stopped (08/26/18 0516)  . piperacillin-tazobactam (ZOSYN)  IV 3.375 g (08/26/18 1341)   PRN Meds: cycloSPORINE, HYDROmorphone (DILAUDID) injection, nitroGLYCERIN,  ondansetron  Allergies:    Allergies  Allergen Reactions  . Ace Inhibitors Other (See Comments)    cough    Social History:   Social History   Socioeconomic History  . Marital status: Married    Spouse name: Not on file  . Number of children: Not on file  . Years of education: Not on file  . Highest education level: Not on file  Occupational History  . Not on file  Social Needs  . Financial resource strain: Not on file  . Food insecurity:    Worry: Not on file    Inability: Not on file  . Transportation needs:    Medical: Not on file    Non-medical: Not on file  Tobacco Use  . Smoking status: Former Smoker    Last attempt to quit: 05/12/1981    Years  since quitting: 37.3  . Smokeless tobacco: Never Used  Substance and Sexual Activity  . Alcohol use: No  . Drug use: No  . Sexual activity: Not on file  Lifestyle  . Physical activity:    Days per week: Not on file    Minutes per session: Not on file  . Stress: Not on file  Relationships  . Social connections:    Talks on phone: Not on file    Gets together: Not on file    Attends religious service: Not on file    Active member of club or organization: Not on file    Attends meetings of clubs or organizations: Not on file    Relationship status: Not on file  . Intimate partner violence:    Fear of current or ex partner: Not on file    Emotionally abused: Not on file    Physically abused: Not on file    Forced sexual activity: Not on file  Other Topics Concern  . Not on file  Social History Narrative  . Not on file    Family History:    Family History  Problem Relation Age of Onset  . Diabetes Mother   . Pancreatic cancer Mother   . Heart attack Father        first MI in ealry 59's, died of MI at 35  . Stroke Father   . Lung cancer Sister   . Hypertension Brother   . Hypertension Brother      ROS:  Please see the history of present illness.   All other ROS reviewed and negative.     Physical  Exam/Data:   Vitals:   08/25/18 1735 08/25/18 1756 08/25/18 2013 08/26/18 0510  BP: 124/71 125/78 129/77 107/72  Pulse: 70 69 66 (!) 58  Resp: 19 16 18 20   Temp:  97.7 F (36.5 C) 97.8 F (36.6 C) (!) 97.2 F (36.2 C)  TempSrc:  Oral Oral Oral  SpO2: 94% 90% 94% 95%  Weight:      Height:        Intake/Output Summary (Last 24 hours) at 08/26/2018 1524 Last data filed at 08/26/2018 1200 Gross per 24 hour  Intake 2506.5 ml  Output 0 ml  Net 2506.5 ml   Last 3 Weights 08/24/2018 07/18/2018 03/21/2018  Weight (lbs) 171 lb 174 lb 3.2 oz 172 lb  Weight (kg) 77.565 kg 79.017 kg 78.019 kg     Body mass index is 27.19 kg/m.  General:  Well nourished, well developed, in no acute distress HEENT: normal Lymph: no adenopathy Neck: no JVD Endocrine:  No thryomegaly Vascular: No carotid bruits; FA pulses 2+ bilaterally without bruits  Cardiac:  normal S1, S2; RRR; no murmur  Lungs:  clear to auscultation bilaterally, no wheezing, rhonchi or rales  Abd: soft, nontender, no hepatomegaly  Ext: no edema Musculoskeletal:  No deformities, BUE and BLE strength normal and equal Skin: warm and dry  Neuro:  CNs 2-12 intact, no focal abnormalities noted Psych:  Normal affect   EKG:  The EKG was personally reviewed and demonstrates:  Sinus rhythm, Consider left atrial enlargement, Right bundle branch block Anterolateral infarct, age indeterminate Telemetry:  Telemetry was personally reviewed and demonstrates:  Sinus rhythm in the 60's-70's with PVCs  Relevant CV Studies:  Echocardiogram 08/19/2018 Study Conclusions - Left ventricle: The cavity size was normal. There was mild focal   basal hypertrophy of the septum. Systolic function was moderately   to severely reduced. The estimated  ejection fraction was in the   range of 30% to 35%. Diffuse hypokinesis. There is akinesis of   the inferolateral and inferior myocardium. Doppler parameters are   consistent with abnormal left ventricular  relaxation (grade 1   diastolic dysfunction). - Aortic valve: There was mild regurgitation. - Aortic root: The aortic root was mildly dilated. - Ascending aorta: The ascending aorta was mildly dilated.  Impressions: - Global hypokinesis with akinesis of the basal inferior and   inferolateral walls; overall moderate to severe LV dysfunction;   mild diastolic dysfunction; mild AI; mildly dilated aortic   root/ascending aorta.  - EF 25% by LV-gram in 2012.   - Echo (8/13) with EF 35-40%.   - Echo (2/19) with EF 30-35%, wall motion abnormalities, normal RV size and systolic function, mild AI.  - Echo (8/19): EF 35-40%, apex/apical septal/apical anterior AK, basal-mid inferior AK, normal RV size and systolic function, mild AI, ascending thoracic aorta 4.4 cm.   Catheterization 04/11/2011 [facility]) HEMODYNAMICS: Aorta 110/70, left ventricle 110/20. Left ventriculography reveals an enlarged chamber. There is global hypokinesis. No mitral regurgitation is noted. Ejection fraction is estimated to be 35%. Coronary arteriography reveals a total occlusion of the right coronary artery. The left main is patent. The LAD is totally occluded after a first diagonal. This appears to be a chronic occlusion. There are some faint collaterals present to the distal LAD. Circumflex and obtuse marginals have wall irregularities. An attempt was made to open the right coronary artery. Multiple wires and techniques were used to no avail. After attempting to get through there was some collateral flow noted in the right, but a wire could never successfully be passed down the vessel. In assessing the entire clinical situation it was felt that this was a very complex scenario with a chronically occluded left anterior descending and then a new event in the region of the right coronary artery. However, the patient was hemodynamically stable and pain free. It was felt at this time that it would be best  to terminate the procedure and then plan on surgical revascularization when the patient is clinically stable. IMPRESSION: 1.  Severe complex cardiovascular disease. 2.Unsuccessful attempt at percutaneous coronary intervention of the right coronary artery.   Laboratory Data:  Chemistry Recent Labs  Lab 08/24/18 1530 08/24/18 2336 08/25/18 0417 08/26/18 0234  NA 138  --  137 139  K 4.5  --  3.7 4.2  CL 104  --  106 109  CO2 22  --  19* 21*  GLUCOSE 133*  --  137* 158*  BUN 27*  --  26* 19  CREATININE 1.76* 1.99* 1.97* 1.73*  CALCIUM 9.3  --  8.1* 8.2*  GFRNONAA 39* 34* 34* 40*  GFRAA 46* 39* 40* 47*  ANIONGAP 12  --  12 9    Recent Labs  Lab 08/24/18 1530 08/25/18 0417 08/26/18 0234  PROT 7.6 5.8* 6.0*  ALBUMIN 4.3 3.2* 3.0*  AST 385* 865* 242*  ALT 167* 907* 532*  ALKPHOS 160* 201* 163*  BILITOT 3.3* 4.7* 5.1*   Hematology Recent Labs  Lab 08/24/18 2336 08/25/18 0417 08/26/18 0234  WBC 10.8* 12.4* 11.6*  RBC 5.40 5.31 5.27  HGB 14.1 13.6 13.8  HCT 43.8 43.0 42.1  MCV 81.1 81.0 79.9*  MCH 26.1 25.6* 26.2  MCHC 32.2 31.6 32.8  RDW 13.7 13.8 14.1  PLT 125* 112* 110*   Cardiac EnzymesNo results for input(s): TROPONINI in the last 168 hours.  Recent  Labs  Lab 08/24/18 1532  TROPIPOC 0.01    BNPNo results for input(s): BNP, PROBNP in the last 168 hours.  DDimer No results for input(s): DDIMER in the last 168 hours.  Radiology/Studies:  Ct Abdomen Pelvis Wo Contrast  Result Date: 08/24/2018 CLINICAL DATA:  Epigastric and chest pain EXAM: CT ABDOMEN AND PELVIS WITHOUT CONTRAST TECHNIQUE: Multidetector CT imaging of the abdomen and pelvis was performed following the standard protocol without IV contrast. COMPARISON:  None. FINDINGS: Lower chest: Mild right basilar atelectasis is noted. Hepatobiliary: Liver is within normal limits. Gallbladder is well distended. There are gallstones identified in the region of the gallbladder neck as well as a density noted  in the distal aspect of the common bile duct in the region of the head of the pancreas. This is consistent with a stone and likely the etiology of the patient's underlying discomfort. Common bile duct measures 15 mm. Pancreas: Pancreas is otherwise within normal limits. Spleen: Spleen is unremarkable. Adrenals/Urinary Tract: Adrenal glands are unremarkable. No renal calculi or obstructive changes are seen. A hypodense lesion is noted within the left kidney likely representing a cyst but incompletely evaluated on this exam. This measures 3 cm. No ureteral stones are seen. The bladder is well distended. Stomach/Bowel: The colon is predominately decompressed. The appendix is within normal limits. No small bowel or gastric abnormality is seen. Vascular/Lymphatic: Aortic atherosclerosis. No enlarged abdominal or pelvic lymph nodes. Reproductive: Prostate is unremarkable. Other: No abdominal wall hernia or abnormality. No abdominopelvic ascites. Musculoskeletal: Degenerative changes of the lumbar spine are seen. IMPRESSION: Evidence of cholelithiasis within the cystic duct as well as the distal common bile duct with prominence of the common bile duct measuring 15 mm. This corresponds to the patient's elevated LFTs and elevated bilirubin and is likely the etiology of the patient's underlying discomfort. Hypodensity within the left kidney likely representing a cyst but incompletely evaluated. Normal-appearing appendix. Electronically Signed   By: Alcide CleverMark  Lukens M.D.   On: 08/24/2018 17:27   Dg Chest Port 1 View  Result Date: 08/24/2018 CLINICAL DATA:  68 year old male with severe chest and abdominal pain, shaking and pale. EXAM: PORTABLE CHEST 1 VIEW COMPARISON:  None. FINDINGS: Portable AP semi upright view at 1605 hours. Tortuous thoracic aorta. Cardiac size at the upper limits of normal to mildly enlarged. Other mediastinal contours are within normal limits. Visualized tracheal air column is within normal limits.  Allowing for portable technique the lungs are clear. No acute osseous abnormality identified. IMPRESSION: 1. No acute pulmonary process. 2. Tortuous thoracic aorta with mild cardiomegaly. Electronically Signed   By: Odessa FlemingH  Hall M.D.   On: 08/24/2018 16:29   Dg Ercp Biliary & Pancreatic Ducts  Result Date: 08/25/2018 CLINICAL DATA:  Cholelithiasis EXAM: ERCP TECHNIQUE: Multiple spot images obtained with the fluoroscopic device and submitted for interpretation post-procedure. FLUOROSCOPY TIME:  Fluoroscopy Time:  1 minutes and 45 seconds Radiation Exposure Index (if provided by the fluoroscopic device): Number of Acquired Spot Images: 0 COMPARISON:  None. FINDINGS: Images document cannulation of the common bile duct and contrast filling the biliary tree. A temporary stent has been placed across the ampulla into the common bile duct. IMPRESSION: See above. These images were submitted for radiologic interpretation only. Please see the procedural report for the amount of contrast and the fluoroscopy time utilized. Electronically Signed   By: Jolaine ClickArthur  Hoss M.D.   On: 08/25/2018 17:12    Assessment and Plan:   Preoperative cardiac evaluation -Patient has a history of  CAD with MI in 2012 and cath revealing chronically occluded LAD and RCA with some collaterals.  Also EF down to 35% and has remained stable about that level.  -Now planned for laparoscopic cholecystectomy. He did well with ERCP. -The patient is having no chest pain or discomfort. Is able to walk on treadmill recently for 30 minutes without symptoms.  -According to the RCRI the patient is a class III risk with 6.6% risk of major cardiac event perioperatively. -He is moderate surgical risk due to his CAD and chronic systolic heart failure/ischemic cardiomyopathy, however, both conditions have been stable for many years. He is on optimal medical therapy for these conditions. He can proceed with surgery without further cardiac testing.  -We will follow  along.   Chronic systolic heart failure/ischemic cardiomyopathy -Followed by the advanced heart failure clinic. -Long standing severely decreased EF since MI in 2012 with EF 35%. Most recent echo on 08/19/18 showed stable EF 30-35% with known wall motion abnormalities.  -Medically managed on with Entresto, carvedilol and Spironolactone.  Lasix was discontinued on 07/18/2018 due to euvolemia and as well as CKD with creatinine 2.12. -Currently the patient's Entresto and Spironolactone are on hold, would resume these meds.  -Pt appears euvolemic at this time.  -Will need to monitor volume status closely -Strict I&O and daily wt.  -Pt usual wt is ~172 lbs, wt 171 on 08/24/18.   CAD -Status post MI and known occluded LAD and RCA from 2012. -Medical management includes aspirin, Plavix, statin -Has known RBBB -Plavix now on hold for surgery, last dose Sunday am. Statin on hold due to acute liver injury.  -Last myoview in 8/20147 showed old scar, no peri infarct ischemia.  -No chest pain or dyspnea.   CKD stage III -Baseline creatinine~1.7-1.9.  Serum creatinine today is 1.73 -Likely hypertensive nephropathy -Being followed by Dr. Marisue Humble of [facility] as outpatient .  Hypertension -Currently well controlled.  Entresto and spironolactone are on hold.  He continues on amlodipine and carvedilol.  Hyperlipidemia -On atorvastatin 40 mg daily.  Last LDL was 59 on 11/15/2017, at goal of <70.   OSA on CPAP -Compliant with CPAP and benefiting from.   Ascending aortic aneurysm -Mildly dilated, Measuring 4.3 cm by echo 08/19/18   For questions or updates, please contact [facility] Please consult [email or web address] for contact info under     Signed, Berton Bon, NP  08/26/2018 3:24 PM   History and all data above reviewed.  Patient examined.  I agree with the findings as above.  The patient has cholecystitis and is likely going to have a lap cholecystectomy.  He has coronary disease and  an ischemic cardiomyopathy as described above.  However, he gets along fairly well and can walk 5 days a week on a treadmill at 3 mph.  He denies any chest pressure or shortness of breath.  He has no high risk clinical findings on exam lab or EKG.   the patient exam reveals COR: Regular rate and rhythm,  Lungs: Clear,  Abd: Positive bowel sounds with minimal tenderness to deep palpation in the midepigastric, Ext no edema.  All available labs, radiology testing, previous records reviewed. Agree with documented assessment and plan.  The patient has a moderate operative risk based on his coronary disease reduced ejection fraction and renal insufficiency.  However, this is probably an overestimation as he is a high functional level and is young.  He is not going for high risk procedure.  I had a long  conversation with the patient and his daughter.  This is certainly not a prohibitive risk and they would be willing to accept this for the indicated therapy and benefits previously discussed.  We would like to restart him on his Entresto and spironolactone to maintain his medications.  We will watch him along with you.  Certainly attention needs to be paid to volume management as he is at imminent risk for hypervolemia heart failure symptoms.  There is no further testing or other change in medications that would reduce his risk.  Fayrene Fearing [place]  5:02 PM  08/26/2018

## 2018-08-26 NOTE — Plan of Care (Signed)
  Problem: Pain Managment: Goal: General experience of comfort will improve Outcome: Progressing   Problem: Safety: Goal: Ability to remain free from injury will improve Outcome: Progressing   Problem: Skin Integrity: Goal: Risk for impaired skin integrity will decrease Outcome: Progressing   

## 2018-08-26 NOTE — Progress Notes (Addendum)
PROGRESS NOTE    Daniel Hurst  PRX:[id number] DOB: 1951/07/01 DOA: 08/24/2018 PCP: Pearson Grippe, MD    Brief Narrative:  68 year old male who presented with abdominal pain.  He does have significant past medical history for coronary disease, dyslipidemia, hypertension and diastolic heart failure.  Patient reported 2-day history of abdominal pain, nausea and vomiting.  On his initial physical examination blood pressure 137/88, heart rate 100, temperature 97.6, respiratory rate 33, oxygen saturation 92%, he had moist mucous membranes, lungs clear to auscultation bilaterally, heart S1-S2 present and rhythmic, abdomen soft, tender to deep palpation in the epigastrium, no guarding or rebound, no lower extremity edema.  Sodium 138, potassium 4.5, chloride 104, bicarb 22, glucose 133, BUN 27, creatinine 1.76.  His AST was 2085, ALT 167, total bilirubin 3.3, white count 8.1, hemoglobin 16.3, hematocrit 51.3, platelets 168.  CT of the abdomen and pelvis showed cholelithiasis within the cystic duct as well as the distal common bile duct with prominence of the common bile duct measuring 15 mm.  Patient was admitted to the hospital working diagnosis cholelithiasis complicated by choledocholithiasis.   Assessment & Plan:   Principal Problem:   Cholelithiasis with choledocholithiasis Active Problems:   CAD (coronary artery disease):  cardiac catheterization June 2012 because of MI revealed occluded RCA and occluded LAD patent circumflex.   HTN (hypertension)   HLD (hyperlipidemia)   Cardiomyopathy, ischemic, EF by Echo 03/24/2012 35-40%   Abdominal pain   Gout  1.  Cholelithiasis with choledocholithiasis complicated with ascending cholangitis (present on admission), and acute liver injury. SP ERCP, found main bile duct dilatation due to obstructive stone, stent was placed in the common bile duct, performed sphincterectomy. Patient placed on IV broad antibiotic therapy with IV Zosyn, continue to follow cell  count and temperature curve. Pending surgical intervention, for cholecystectomy. Elevated liver function test due to obstructive cholangitis, common bile duct obstructive stones.   2.  Hypertension. On amlodipine and carvedilol for blood pressure control.   3.  Ischemic cardiomyopathy with chronic stable systolic heart failure. EF 35 to 40%, Tolerating well IV fluids, clopidogrel has been held for surgical procedure. Patient is physically functional at home, his revised cardiac index is class 2, he has a NYHA class 2. Follow on cardiology recommendations. Patient had MI in 2012 with occulded LAD to RCA with no intervention.   4.  Dyslipidemia. Elevated LFT due to cholestasis, will continue to hold statin for now.   5.  Coronary artery disease. Patient is chest pain free.  DVT prophylaxis: enoxaparin   Code Status:  full Family Communication: I spoke with patient's family at the bedside and all questions were addressed.  Disposition Plan/ discharge barriers: pending ercp and surgery evaluation   Body mass index is 27.19 kg/m. Malnutrition Type:      Malnutrition Characteristics:      Nutrition Interventions:     RN Pressure Injury Documentation:     Consultants:   GI   Surgery   Cardiology   Procedures:     Antimicrobials:    IV zosyn    Subjective: Patient is feeling better, his abdominal pain has improved but not back to baseline, no nausea or vomiting, no chest pain or dyspnea.   Objective: Vitals:   08/25/18 1735 08/25/18 1756 08/25/18 2013 08/26/18 0510  BP: 124/71 125/78 129/77 107/72  Pulse: 70 69 66 (!) 58  Resp: 19 16 18 20   Temp:  97.7 F (36.5 C) 97.8 F (36.6 C) (!) 97.2 F (36.2 C)  TempSrc:  Oral Oral Oral  SpO2: 94% 90% 94% 95%  Weight:      Height:        Intake/Output Summary (Last 24 hours) at 08/26/2018 1529 Last data filed at 08/26/2018 1200 Gross per 24 hour  Intake 2506.5 ml  Output 0 ml  Net 2506.5 ml   Filed  Weights   08/24/18 1522  Weight: 77.6 kg    Examination:   General: deconditioned, no in pain or dyspnea.  Neurology: Awake and alert, non focal  E ENT: positive pallor, no icterus, oral mucosa moist Cardiovascular: No JVD. S1-S2 present, rhythmic, no gallops, rubs, or murmurs. No lower extremity edema. Pulmonary: vesicular breath sounds bilaterally, adequate air movement, no wheezing, rhonchi or rales. Gastrointestinal. Abdomen with, no organomegaly, mild  Tender to deep palpation, no rebound or guarding Skin. No rashes Musculoskeletal: no joint deformities     Data Reviewed: I have personally reviewed following labs and imaging studies  CBC: Recent Labs  Lab 08/24/18 1530 08/24/18 2336 08/25/18 0417 08/26/18 0234  WBC 8.1 10.8* 12.4* 11.6*  NEUTROABS  --   --   --  11.1*  HGB 16.3 14.1 13.6 13.8  HCT 51.3 43.8 43.0 42.1  MCV 83.4 81.1 81.0 79.9*  PLT 168 125* 112* 110*   Basic Metabolic Panel: Recent Labs  Lab 08/24/18 1530 08/24/18 2336 08/25/18 0417 08/26/18 0234  NA 138  --  137 139  K 4.5  --  3.7 4.2  CL 104  --  106 109  CO2 22  --  19* 21*  GLUCOSE 133*  --  137* 158*  BUN 27*  --  26* 19  CREATININE 1.76* 1.99* 1.97* 1.73*  CALCIUM 9.3  --  8.1* 8.2*   GFR: Estimated Creatinine Clearance: 38.6 mL/min (A) (by C-G formula based on SCr of 1.73 mg/dL (H)). Liver Function Tests: Recent Labs  Lab 08/24/18 1530 08/25/18 0417 08/26/18 0234  AST 385* 865* 242*  ALT 167* 907* 532*  ALKPHOS 160* 201* 163*  BILITOT 3.3* 4.7* 5.1*  PROT 7.6 5.8* 6.0*  ALBUMIN 4.3 3.2* 3.0*   Recent Labs  Lab 08/24/18 1530 08/26/18 0234  LIPASE 50 25   No results for input(s): AMMONIA in the last 168 hours. Coagulation Profile: Recent Labs  Lab 08/24/18 1530  INR 1.06   Cardiac Enzymes: No results for input(s): CKTOTAL, CKMB, CKMBINDEX, TROPONINI in the last 168 hours. BNP (last 3 results) No results for input(s): PROBNP in the last 8760 hours. HbA1C: No  results for input(s): HGBA1C in the last 72 hours. CBG: No results for input(s): GLUCAP in the last 168 hours. Lipid Profile: No results for input(s): CHOL, HDL, LDLCALC, TRIG, CHOLHDL, LDLDIRECT in the last 72 hours. Thyroid Function Tests: No results for input(s): TSH, T4TOTAL, FREET4, T3FREE, THYROIDAB in the last 72 hours. Anemia Panel: No results for input(s): VITAMINB12, FOLATE, FERRITIN, TIBC, IRON, RETICCTPCT in the last 72 hours.    Radiology Studies: I have reviewed all of the imaging during this hospital visit personally     Scheduled Meds: . allopurinol  150 mg Oral Daily  . amLODipine  5 mg Oral Daily  . aspirin EC  81 mg Oral Daily  . carvedilol  25 mg Oral BID  . famotidine  10 mg Oral Daily  . omega-3 acid ethyl esters  2 g Oral BID   Continuous Infusions: . sodium chloride Stopped (08/26/18 0516)  . piperacillin-tazobactam (ZOSYN)  IV 3.375 g (08/26/18 1341)  LOS: 2 days        Tawni Millers, MD [facility] Pager [phone]

## 2018-08-27 ENCOUNTER — Encounter (HOSPITAL_COMMUNITY): Payer: Self-pay | Admitting: Gastroenterology

## 2018-08-27 LAB — BASIC METABOLIC PANEL
Anion gap: 9 (ref 5–15)
BUN: 23 mg/dL (ref 8–23)
CO2: 18 mmol/L — ABNORMAL LOW (ref 22–32)
Calcium: 8 mg/dL — ABNORMAL LOW (ref 8.9–10.3)
Chloride: 110 mmol/L (ref 98–111)
Creatinine, Ser: 1.64 mg/dL — ABNORMAL HIGH (ref 0.61–1.24)
GFR calc Af Amer: 50 mL/min — ABNORMAL LOW (ref >60)
GFR calc non Af Amer: 43 mL/min — ABNORMAL LOW (ref >60)
Glucose, Bld: 152 mg/dL — ABNORMAL HIGH (ref 70–99)
Potassium: 3.8 mmol/L (ref 3.5–5.1)
Sodium: 137 mmol/L (ref 135–145)

## 2018-08-27 LAB — CBC WITH DIFFERENTIAL/PLATELET
Band Neutrophils: 0 %
Basophils Absolute: 0 10*3/uL (ref 0.0–0.1)
Basophils Relative: 0 %
Blasts: 0 %
Eosinophils Absolute: 0 10*3/uL (ref 0.0–0.5)
Eosinophils Relative: 0 %
HCT: 41.7 % (ref 39.0–52.0)
Hemoglobin: 13.5 g/dL (ref 13.0–17.0)
Lymphocytes Relative: 4 %
Lymphs Abs: 0.6 10*3/uL — ABNORMAL LOW (ref 0.7–4.0)
MCH: 25.7 pg — ABNORMAL LOW (ref 26.0–34.0)
MCHC: 32.4 g/dL (ref 30.0–36.0)
MCV: 79.4 fL — AB (ref 80.0–100.0)
MYELOCYTES: 0 %
Metamyelocytes Relative: 0 %
Monocytes Absolute: 0.5 10*3/uL (ref 0.1–1.0)
Monocytes Relative: 3 %
NRBC: 0 /100{WBCs}
Neutro Abs: 14.6 10*3/uL — ABNORMAL HIGH (ref 1.7–7.7)
Neutrophils Relative %: 93 %
Other: 0 %
PLATELETS: 112 10*3/uL — AB (ref 150–400)
Promyelocytes Relative: 0 %
RBC: 5.25 MIL/uL (ref 4.22–5.81)
RDW: 14.1 % (ref 11.5–15.5)
WBC: 15.7 10*3/uL — ABNORMAL HIGH (ref 4.0–10.5)
nRBC: 0 % (ref 0.0–0.2)

## 2018-08-27 LAB — HEPATIC FUNCTION PANEL
ALT: 339 U/L — ABNORMAL HIGH (ref 0–44)
AST: 107 U/L — ABNORMAL HIGH (ref 15–41)
Albumin: 2.9 g/dL — ABNORMAL LOW (ref 3.5–5.0)
Alkaline Phosphatase: 150 U/L — ABNORMAL HIGH (ref 38–126)
Bilirubin, Direct: 1.2 mg/dL — ABNORMAL HIGH (ref 0.0–0.2)
Indirect Bilirubin: 0.8 mg/dL (ref 0.3–0.9)
Total Bilirubin: 2 mg/dL — ABNORMAL HIGH (ref 0.3–1.2)
Total Protein: 5.8 g/dL — ABNORMAL LOW (ref 6.5–8.1)

## 2018-08-27 MED ORDER — AMLODIPINE BESYLATE 2.5 MG PO TABS
2.5000 mg | ORAL_TABLET | Freq: Every day | ORAL | Status: DC
  Administered 2018-08-28 – 2018-08-29 (×3): 2.5 mg via ORAL
  Filled 2018-08-27 (×2): qty 1

## 2018-08-27 NOTE — Plan of Care (Signed)
  Problem: Clinical Measurements: Goal: Ability to maintain clinical measurements within normal limits will improve Outcome: Progressing   Problem: Clinical Measurements: Goal: Will remain free from infection Outcome: Progressing   Problem: Clinical Measurements: Goal: Cardiovascular complication will be avoided Outcome: Progressing   Problem: Elimination: Goal: Will not experience complications related to bowel motility Outcome: Progressing

## 2018-08-27 NOTE — Progress Notes (Signed)
Patient ID: Daniel Hurst, male   DOB: 1951-04-08, 68 y.o.   MRN: [id number]    2 Days Post-Op  Subjective: No new issues.  Minimal discomfort.  Tolerating clear liquids with no issues  Objective: Vital signs in last 24 hours: Temp:  [97.3 F (36.3 C)-97.6 F (36.4 C)] 97.3 F (36.3 C) (01/22 0420) Pulse Rate:  [57-61] 57 (01/22 0420) Resp:  [16-20] 16 (01/22 0420) BP: (110-114)/(69-72) 112/69 (01/22 0420) SpO2:  [96 %-98 %] 96 % (01/22 0420) Weight:  [80.1 kg] 80.1 kg (01/22 0418) Last BM Date: 08/26/18  Intake/Output from previous day: 01/21 0701 - 01/22 0700 In: 1988.7 [P.O.:1440; I.V.:500; IV Piggyback:48.7] Out: -  Intake/Output this shift: No intake/output data recorded.  PE: Heart: regular Lungs: CTAB Abd: soft, essentially NT, maybe minimally in RUQ, +BS, ND  Lab Results:  Recent Labs    08/26/18 0234 08/27/18 0152  WBC 11.6* 15.7*  HGB 13.8 13.5  HCT 42.1 41.7  PLT 110* 112*   BMET Recent Labs    08/26/18 0234 08/27/18 0152  NA 139 137  K 4.2 3.8  CL 109 110  CO2 21* 18*  GLUCOSE 158* 152*  BUN 19 23  CREATININE 1.73* 1.64*  CALCIUM 8.2* 8.0*   PT/INR Recent Labs    08/24/18 1530  LABPROT 13.7  INR 1.06   CMP     Component Value Date/Time   NA 137 08/27/2018 0152   NA 142 10/09/2017 0857   K 3.8 08/27/2018 0152   CL 110 08/27/2018 0152   CO2 18 (L) 08/27/2018 0152   GLUCOSE 152 (H) 08/27/2018 0152   BUN 23 08/27/2018 0152   BUN 26 10/09/2017 0857   CREATININE 1.64 (H) 08/27/2018 0152   CALCIUM 8.0 (L) 08/27/2018 0152   PROT 5.8 (L) 08/27/2018 0152   ALBUMIN 2.9 (L) 08/27/2018 0152   AST 107 (H) 08/27/2018 0152   ALT 339 (H) 08/27/2018 0152   ALKPHOS 150 (H) 08/27/2018 0152   BILITOT 2.0 (H) 08/27/2018 0152   GFRNONAA 43 (L) 08/27/2018 0152   GFRAA 50 (L) 08/27/2018 0152   Lipase     Component Value Date/Time   LIPASE 25 08/26/2018 0234       Studies/Results: Dg Ercp Biliary & Pancreatic Ducts  Result Date:  08/25/2018 CLINICAL DATA:  Cholelithiasis EXAM: ERCP TECHNIQUE: Multiple spot images obtained with the fluoroscopic device and submitted for interpretation post-procedure. FLUOROSCOPY TIME:  Fluoroscopy Time:  1 minutes and 45 seconds Radiation Exposure Index (if provided by the fluoroscopic device): Number of Acquired Spot Images: 0 COMPARISON:  None. FINDINGS: Images document cannulation of the common bile duct and contrast filling the biliary tree. A temporary stent has been placed across the ampulla into the common bile duct. IMPRESSION: See above. These images were submitted for radiologic interpretation only. Please see the procedural report for the amount of contrast and the fluoroscopy time utilized. Electronically Signed   By: Jolaine Click M.D.   On: 08/25/2018 17:12    Anti-infectives: Anti-infectives (From admission, onward)   Start     Dose/Rate Route Frequency Ordered Stop   08/25/18 2200  piperacillin-tazobactam (ZOSYN) IVPB 3.375 g     3.375 g 12.5 mL/hr over 240 Minutes Intravenous Every 8 hours 08/25/18 1621         Assessment/Plan CAD with known occluded RCA and LAD H/o MI 2012 on Plavix (last dose 1/19) CHF/ischemic cardiomyopathy (EF 30-35% 08/19/2018) CKD-III HLD HTN OSA Gout RBBB  Choledocholithiasis - s/p ERCP 1/20 by  Dr. Elnoria HowardHung with biliary sphincterotomy and one biliary stent was placed into the common bile duct. -Friday will be 5 days off plavix.  Plan for OR at that time. -adv to low fat diet.  ID - zosyn 1/20>> VTE - SCDs, lovenox FEN - IVF, low fat diet Foley - none Follow up - TBD    LOS: 3 days    Letha CapeKelly E Anthea Udovich , [facility] 08/27/2018, 8:43 AM Pager: [phone]

## 2018-08-27 NOTE — Progress Notes (Signed)
PROGRESS NOTE    Daniel Hurst  QIW:[id number] DOB: Jan 17, 1951 DOA: 08/24/2018 PCP: Pearson Grippe, MD     Brief Narrative:  Daniel Hurst is a 68 year old male who presented with abdominal pain. He does have significant past medical history for coronary disease, dyslipidemia, hypertension and diastolic heart failure.Patient reported 2-day history of abdominal pain, nausea and vomiting. CT of the abdomen and pelvis showed cholelithiasis within the cystic duct as well as the distal common bile duct with prominence of the common bile duct measuring 15 mm. Patient was admitted to the hospital working diagnosis cholelithiasis complicated by choledocholithiasis. He underwent ERCP.   New events last 24 hours / Subjective: No complaints today.  Denies any worsening abdominal pain, tolerating clear liquid diet.  No nausea or vomiting.  Assessment & Plan:   Principal Problem:   Cholelithiasis with choledocholithiasis Active Problems:   CAD (coronary artery disease):  cardiac catheterization June 2012 because of MI revealed occluded RCA and occluded LAD patent circumflex.   HTN (hypertension)   HLD (hyperlipidemia)   Cardiomyopathy, ischemic, EF by Echo 03/24/2012 35-40%   Abdominal pain   Gout  Cholelithiasis with choledocholithiasis complicated with ascending cholangitis (present on admission), and acute liver injury -S/p ERCP, found main bile duct dilatation due to obstructive stone, stent was placed in the common bile duct, performed sphincterectomy.  Follow-up with Dr. Elnoria Howard to schedule stent removal -IV zosyn  -Lap chole planned 1/24 once off plavix for 5 days   Ischemic cardiomyopathy with chronic systolic heart failure -EF 35 to 40% -Appreciate cardiology consultation for preop cardiac clearance -Resume Entresto, spironolactone, continue carvedilol, aspirin -Plavix on hold pending surgery  Hypertension -Continue amlodipine, carvedilol  Dyslipidemia -Lipitor on hold due to elevated liver  enzymes  Elevated liver enzymes -Improving, continue to trend  Chronic kidney disease stage III -Baseline serum creatinine 1.7-1.9 -Follows with Dr. Marisue Humble at [facility] -Creatinine remained stable today  OSA -CPAP qhs    DVT prophylaxis: Lovenox Code Status: Full code Family Communication: Daughter at bedside  Disposition Plan: Pending cholecystectomy tentatively planned for 1/24    Consultants:   GI  General surgery  Cardiology  Procedures:   ERCP   Antimicrobials:  Anti-infectives (From admission, onward)   Start     Dose/Rate Route Frequency Ordered Stop   08/25/18 2200  piperacillin-tazobactam (ZOSYN) IVPB 3.375 g     3.375 g 12.5 mL/hr over 240 Minutes Intravenous Every 8 hours 08/25/18 1621         Objective: Vitals:   08/26/18 1542 08/26/18 2109 08/27/18 0418 08/27/18 0420  BP: 114/70 110/72  112/69  Pulse: 61 (!) 57  (!) 57  Resp: 16 20  16   Temp: 97.6 F (36.4 C) (!) 97.5 F (36.4 C)  (!) 97.3 F (36.3 C)  TempSrc: Oral Oral  Oral  SpO2: 98% 98%  96%  Weight:   80.1 kg   Height:        Intake/Output Summary (Last 24 hours) at 08/27/2018 1038 Last data filed at 08/26/2018 1840 Gross per 24 hour  Intake 1508.7 ml  Output -  Net 1508.7 ml   Filed Weights   08/24/18 1522 08/27/18 0418  Weight: 77.6 kg 80.1 kg    Examination:  General exam: Appears calm and comfortable  Respiratory system: Clear to auscultation. Respiratory effort normal. Cardiovascular system: S1 & S2 heard, RRR. No JVD, murmurs, rubs, gallops or clicks. No pedal edema. Gastrointestinal system: Abdomen is nondistended, soft and nontender. No organomegaly or masses felt.  Normal bowel sounds heard. Central nervous system: Alert and oriented. No focal neurological deficits. Extremities: Symmetric 5 x 5 power. Skin: No rashes, lesions or ulcers Psychiatry: Judgement and insight appear normal. Mood & affect appropriate.   Data Reviewed: I have personally  reviewed following labs and imaging studies  CBC: Recent Labs  Lab 08/24/18 1530 08/24/18 2336 08/25/18 0417 08/26/18 0234 08/27/18 0152  WBC 8.1 10.8* 12.4* 11.6* 15.7*  NEUTROABS  --   --   --  11.1* 14.6*  HGB 16.3 14.1 13.6 13.8 13.5  HCT 51.3 43.8 43.0 42.1 41.7  MCV 83.4 81.1 81.0 79.9* 79.4*  PLT 168 125* 112* 110* 112*   Basic Metabolic Panel: Recent Labs  Lab 08/24/18 1530 08/24/18 2336 08/25/18 0417 08/26/18 0234 08/27/18 0152  NA 138  --  137 139 137  K 4.5  --  3.7 4.2 3.8  CL 104  --  106 109 110  CO2 22  --  19* 21* 18*  GLUCOSE 133*  --  137* 158* 152*  BUN 27*  --  26* 19 23  CREATININE 1.76* 1.99* 1.97* 1.73* 1.64*  CALCIUM 9.3  --  8.1* 8.2* 8.0*   GFR: Estimated Creatinine Clearance: 44.5 mL/min (A) (by C-G formula based on SCr of 1.64 mg/dL (H)). Liver Function Tests: Recent Labs  Lab 08/24/18 1530 08/25/18 0417 08/26/18 0234 08/27/18 0152  AST 385* 865* 242* 107*  ALT 167* 907* 532* 339*  ALKPHOS 160* 201* 163* 150*  BILITOT 3.3* 4.7* 5.1* 2.0*  PROT 7.6 5.8* 6.0* 5.8*  ALBUMIN 4.3 3.2* 3.0* 2.9*   Recent Labs  Lab 08/24/18 1530 08/26/18 0234  LIPASE 50 25   No results for input(s): AMMONIA in the last 168 hours. Coagulation Profile: Recent Labs  Lab 08/24/18 1530  INR 1.06   Cardiac Enzymes: No results for input(s): CKTOTAL, CKMB, CKMBINDEX, TROPONINI in the last 168 hours. BNP (last 3 results) No results for input(s): PROBNP in the last 8760 hours. HbA1C: No results for input(s): HGBA1C in the last 72 hours. CBG: No results for input(s): GLUCAP in the last 168 hours. Lipid Profile: No results for input(s): CHOL, HDL, LDLCALC, TRIG, CHOLHDL, LDLDIRECT in the last 72 hours. Thyroid Function Tests: No results for input(s): TSH, T4TOTAL, FREET4, T3FREE, THYROIDAB in the last 72 hours. Anemia Panel: No results for input(s): VITAMINB12, FOLATE, FERRITIN, TIBC, IRON, RETICCTPCT in the last 72 hours. Sepsis Labs: Recent Labs   Lab 08/24/18 1535  LATICACIDVEN 2.53*    No results found for this or any previous visit (from the past 240 hour(s)).     Radiology Studies: Dg Ercp Biliary & Pancreatic Ducts  Result Date: 08/25/2018 CLINICAL DATA:  Cholelithiasis EXAM: ERCP TECHNIQUE: Multiple spot images obtained with the fluoroscopic device and submitted for interpretation post-procedure. FLUOROSCOPY TIME:  Fluoroscopy Time:  1 minutes and 45 seconds Radiation Exposure Index (if provided by the fluoroscopic device): Number of Acquired Spot Images: 0 COMPARISON:  None. FINDINGS: Images document cannulation of the common bile duct and contrast filling the biliary tree. A temporary stent has been placed across the ampulla into the common bile duct. IMPRESSION: See above. These images were submitted for radiologic interpretation only. Please see the procedural report for the amount of contrast and the fluoroscopy time utilized. Electronically Signed   By: Jolaine ClickArthur  Hoss M.D.   On: 08/25/2018 17:12      Scheduled Meds: . allopurinol  150 mg Oral Daily  . amLODipine  5 mg Oral Daily  .  aspirin EC  81 mg Oral Daily  . carvedilol  25 mg Oral BID  . famotidine  10 mg Oral Daily  . omega-3 acid ethyl esters  2 g Oral BID  . sacubitril-valsartan  1 tablet Oral BID  . spironolactone  25 mg Oral Daily   Continuous Infusions: . piperacillin-tazobactam (ZOSYN)  IV 3.375 g (08/27/18 0600)     LOS: 3 days    Time spent: 35 minutes   Noralee Stain, DO [facility] [email or web address] 08/27/2018, 10:38 AM

## 2018-08-27 NOTE — Progress Notes (Signed)
Progress Note  Patient Name: Daniel Hurst Date of Encounter: 08/27/2018  Primary Cardiologist:   Nanetta Batty, MD   Subjective   No chest pain.  No SOB.  Mild abdominal pain.   Inpatient Medications    Scheduled Meds: . allopurinol  150 mg Oral Daily  . amLODipine  5 mg Oral Daily  . aspirin EC  81 mg Oral Daily  . carvedilol  25 mg Oral BID  . famotidine  10 mg Oral Daily  . omega-3 acid ethyl esters  2 g Oral BID  . sacubitril-valsartan  1 tablet Oral BID  . spironolactone  25 mg Oral Daily   Continuous Infusions: . piperacillin-tazobactam (ZOSYN)  IV 3.375 g (08/27/18 0600)   PRN Meds: cycloSPORINE, HYDROmorphone (DILAUDID) injection, nitroGLYCERIN, ondansetron   Vital Signs    Vitals:   08/26/18 2109 08/27/18 0418 08/27/18 0420 08/27/18 1048  BP: 110/72  112/69   Pulse: (!) 57  (!) 57   Resp: 20  16   Temp: (!) 97.5 F (36.4 C)  (!) 97.3 F (36.3 C)   TempSrc: Oral  Oral   SpO2: 98%  96%   Weight:  80.1 kg  78.2 kg  Height:        Intake/Output Summary (Last 24 hours) at 08/27/2018 1133 Last data filed at 08/27/2018 1018 Gross per 24 hour  Intake 2949.53 ml  Output -  Net 2949.53 ml   Filed Weights   08/24/18 1522 08/27/18 0418 08/27/18 1048  Weight: 77.6 kg 80.1 kg 78.2 kg    Telemetry    NA - Personally Reviewed  ECG    NA - Personally Reviewed  Physical Exam    GEN: No acute distress.   Neck: No  JVD Cardiac: RRR, no murmurs, rubs, or gallops.  Respiratory: Clear  to auscultation bilaterally. GI: Soft, nontender, non-distended  MS: No  edema; No deformity. Neuro:  Nonfocal  Psych: Normal affect   Labs    Chemistry Recent Labs  Lab 08/25/18 0417 08/26/18 0234 08/27/18 0152  NA 137 139 137  K 3.7 4.2 3.8  CL 106 109 110  CO2 19* 21* 18*  GLUCOSE 137* 158* 152*  BUN 26* 19 23  CREATININE 1.97* 1.73* 1.64*  CALCIUM 8.1* 8.2* 8.0*  PROT 5.8* 6.0* 5.8*  ALBUMIN 3.2* 3.0* 2.9*  AST 865* 242* 107*  ALT 907* 532* 339*    ALKPHOS 201* 163* 150*  BILITOT 4.7* 5.1* 2.0*  GFRNONAA 34* 40* 43*  GFRAA 40* 47* 50*  ANIONGAP 12 9 9      Hematology Recent Labs  Lab 08/25/18 0417 08/26/18 0234 08/27/18 0152  WBC 12.4* 11.6* 15.7*  RBC 5.31 5.27 5.25  HGB 13.6 13.8 13.5  HCT 43.0 42.1 41.7  MCV 81.0 79.9* 79.4*  MCH 25.6* 26.2 25.7*  MCHC 31.6 32.8 32.4  RDW 13.8 14.1 14.1  PLT 112* 110* 112*    Cardiac EnzymesNo results for input(s): TROPONINI in the last 168 hours.  Recent Labs  Lab 08/24/18 1532  TROPIPOC 0.01     BNPNo results for input(s): BNP, PROBNP in the last 168 hours.   DDimer No results for input(s): DDIMER in the last 168 hours.   Radiology    Dg Ercp Biliary & Pancreatic Ducts  Result Date: 08/25/2018 CLINICAL DATA:  Cholelithiasis EXAM: ERCP TECHNIQUE: Multiple spot images obtained with the fluoroscopic device and submitted for interpretation post-procedure. FLUOROSCOPY TIME:  Fluoroscopy Time:  1 minutes and 45 seconds Radiation Exposure Index (if provided  by the fluoroscopic device): Number of Acquired Spot Images: 0 COMPARISON:  None. FINDINGS: Images document cannulation of the common bile duct and contrast filling the biliary tree. A temporary stent has been placed across the ampulla into the common bile duct. IMPRESSION: See above. These images were submitted for radiologic interpretation only. Please see the procedural report for the amount of contrast and the fluoroscopy time utilized. Electronically Signed   By: Jolaine ClickArthur  Hoss M.D.   On: 08/25/2018 17:12    Cardiac Studies   ECHO 1/14  Study Conclusions  - Left ventricle: The cavity size was normal. There was mild focal   basal hypertrophy of the septum. Systolic function was moderately   to severely reduced. The estimated ejection fraction was in the   range of 30% to 35%. Diffuse hypokinesis. There is akinesis of   the inferolateral and inferior myocardium. Doppler parameters are   consistent with abnormal left  ventricular relaxation (grade 1   diastolic dysfunction). - Aortic valve: There was mild regurgitation. - Aortic root: The aortic root was mildly dilated. - Ascending aorta: The ascending aorta was mildly dilated.   Patient Profile     68 y.o. male with a hx of CAD, chronic systolic CHF/ischemic cardiomyopathy, CKD stage III, OSA on CPAP, hyperlipidemia, mild aortic insufficiency, ascending aortic aneurysm who is being seen for preoperative evaluation at the request of Dr. Ella JubileeArrien.  Assessment & Plan    ISCHEMIC CARDIOMYOPATHY:  I restarted Entresto and spironolactone yesterday.  BP is soft.  I am going to reduce the Norvasc for now.  Follow creat and BPs.    CAD:  Holding Plavix.    For questions or updates, please contact [facility] Please consult [email or web address] for contact info under Cardiology/STEMI.   Signed, Rollene RotundaJames Kellon Chalk, MD  08/27/2018, 11:33 AM

## 2018-08-27 NOTE — Progress Notes (Signed)
Patient required no assistance with home CPAP at this time, no distress noted.

## 2018-08-28 DIAGNOSIS — I5022 Chronic systolic (congestive) heart failure: Secondary | ICD-10-CM

## 2018-08-28 LAB — HEPATIC FUNCTION PANEL
ALBUMIN: 2.8 g/dL — AB (ref 3.5–5.0)
ALK PHOS: 154 U/L — AB (ref 38–126)
ALT: 234 U/L — ABNORMAL HIGH (ref 0–44)
AST: 56 U/L — ABNORMAL HIGH (ref 15–41)
Bilirubin, Direct: 0.6 mg/dL — ABNORMAL HIGH (ref 0.0–0.2)
Indirect Bilirubin: 0.6 mg/dL (ref 0.3–0.9)
Total Bilirubin: 1.2 mg/dL (ref 0.3–1.2)
Total Protein: 5.6 g/dL — ABNORMAL LOW (ref 6.5–8.1)

## 2018-08-28 LAB — BASIC METABOLIC PANEL
ANION GAP: 7 (ref 5–15)
BUN: 27 mg/dL — ABNORMAL HIGH (ref 8–23)
CO2: 20 mmol/L — ABNORMAL LOW (ref 22–32)
Calcium: 8.4 mg/dL — ABNORMAL LOW (ref 8.9–10.3)
Chloride: 109 mmol/L (ref 98–111)
Creatinine, Ser: 1.69 mg/dL — ABNORMAL HIGH (ref 0.61–1.24)
GFR calc Af Amer: 48 mL/min — ABNORMAL LOW (ref >60)
GFR calc non Af Amer: 41 mL/min — ABNORMAL LOW (ref >60)
Glucose, Bld: 122 mg/dL — ABNORMAL HIGH (ref 70–99)
Potassium: 3.7 mmol/L (ref 3.5–5.1)
Sodium: 136 mmol/L (ref 135–145)

## 2018-08-28 LAB — CBC
HCT: 42.9 % (ref 39.0–52.0)
Hemoglobin: 14.2 g/dL (ref 13.0–17.0)
MCH: 26.1 pg (ref 26.0–34.0)
MCHC: 33.1 g/dL (ref 30.0–36.0)
MCV: 78.7 fL — ABNORMAL LOW (ref 80.0–100.0)
Platelets: 144 10*3/uL — ABNORMAL LOW (ref 150–400)
RBC: 5.45 MIL/uL (ref 4.22–5.81)
RDW: 14.1 % (ref 11.5–15.5)
WBC: 12.7 10*3/uL — ABNORMAL HIGH (ref 4.0–10.5)
nRBC: 0 % (ref 0.0–0.2)

## 2018-08-28 MED ORDER — POLYETHYLENE GLYCOL 3350 17 G PO PACK: 17.0000 g | PACK | Freq: Every day | ORAL | Status: DC | PRN

## 2018-08-28 MED ORDER — SENNOSIDES-DOCUSATE SODIUM 8.6-50 MG PO TABS: 1.0000 | ORAL_TABLET | Freq: Every evening | ORAL | Status: DC | PRN

## 2018-08-28 MED ORDER — DOCUSATE SODIUM 100 MG PO CAPS: 100.0000 mg | ORAL_CAPSULE | Freq: Two times a day (BID) | ORAL | Status: DC | PRN

## 2018-08-28 NOTE — Plan of Care (Signed)

## 2018-08-28 NOTE — Progress Notes (Signed)
Rt note: home unit at bedside patient stated no need for help family will.  Told to call if anything needed made aware of safety plugs will check back ;later

## 2018-08-28 NOTE — Progress Notes (Addendum)
Patient ID: Daniel Hurst, male   DOB: 01/22/1951, 68 y.o.   MRN: [id number]    3 Days Post-Op  Subjective: Patient with no complaints.  Sleeping.  Tolerated low fat diet yesterday.  No pain  Objective: Vital signs in last 24 hours: Temp:  [97.5 F (36.4 C)-98 F (36.7 C)] 98 F (36.7 C) (01/23 0345) Pulse Rate:  [55-59] 56 (01/23 0345) Resp:  [14-20] 14 (01/23 0345) BP: (102-111)/(60-74) 111/74 (01/23 0345) SpO2:  [96 %-99 %] 99 % (01/23 0345) Weight:  [78.2 kg-79.4 kg] 79.4 kg (01/23 0609) Last BM Date: 08/26/18  Intake/Output from previous day: 01/22 0701 - 01/23 0700 In: 1950 [P.O.:1920; I.V.:30] Out: 1600 [Urine:1600] Intake/Output this shift: No intake/output data recorded.  PE: Heart: regular Lungs: CTAB Abd: soft, NT, ND, +BS  Lab Results:  Recent Labs    08/27/18 0152 08/28/18 0319  WBC 15.7* 12.7*  HGB 13.5 14.2  HCT 41.7 42.9  PLT 112* 144*   BMET Recent Labs    08/27/18 0152 08/28/18 0319  NA 137 136  K 3.8 3.7  CL 110 109  CO2 18* 20*  GLUCOSE 152* 122*  BUN 23 27*  CREATININE 1.64* 1.69*  CALCIUM 8.0* 8.4*   PT/INR No results for input(s): LABPROT, INR in the last 72 hours. CMP     Component Value Date/Time   NA 136 08/28/2018 0319   NA 142 10/09/2017 0857   K 3.7 08/28/2018 0319   CL 109 08/28/2018 0319   CO2 20 (L) 08/28/2018 0319   GLUCOSE 122 (H) 08/28/2018 0319   BUN 27 (H) 08/28/2018 0319   BUN 26 10/09/2017 0857   CREATININE 1.69 (H) 08/28/2018 0319   CALCIUM 8.4 (L) 08/28/2018 0319   PROT 5.6 (L) 08/28/2018 0319   ALBUMIN 2.8 (L) 08/28/2018 0319   AST 56 (H) 08/28/2018 0319   ALT 234 (H) 08/28/2018 0319   ALKPHOS 154 (H) 08/28/2018 0319   BILITOT 1.2 08/28/2018 0319   GFRNONAA 41 (L) 08/28/2018 0319   GFRAA 48 (L) 08/28/2018 0319   Lipase     Component Value Date/Time   LIPASE 25 08/26/2018 0234       Studies/Results: No results found.  Anti-infectives: Anti-infectives (From admission, onward)   Start      Dose/Rate Route Frequency Ordered Stop   08/25/18 2200  piperacillin-tazobactam (ZOSYN) IVPB 3.375 g     3.375 g 12.5 mL/hr over 240 Minutes Intravenous Every 8 hours 08/25/18 1621         Assessment/Plan CAD with known occluded RCA and LAD H/o MI 2012 on Plavix (last dose1/19) CHF/ischemic cardiomyopathy (EF 30-35% 08/19/2018) CKD-III HLD HTN OSA Gout RBBB  Choledocholithiasis - s/p ERCP 1/20 by Dr. Elnoria Howard withbiliary sphincterotomyand one biliary stent was placed into the common bile duct. -Friday will be 5 days off plavix.  Plan for OR tomorrow -low fat diet today, NPo p MN for tomorrow. -cleared by cards.  See Dr. Lucilla Lame note from yesterday for further details  ID -zosyn 1/20>> VTE -SCDs FEN -IVF,low fat diet, NPO p MN Foley -none Follow up -TBD   LOS: 4 days    Letha Cape , [facility] 08/28/2018, 8:18 AM Pager: [phone]

## 2018-08-28 NOTE — Progress Notes (Signed)
PROGRESS NOTE    Daniel Hurst  ZOX:[id number] DOB: March 03, 1951 DOA: 08/24/2018 PCP: Pearson GrippeKim, James, MD      Brief Narrative:  Daniel Hurst is a 68 y.o. M with CAD, isch CM with chronic sCHF EF 30-35%, HTN, CKD III baseline Cr 1.7, and gout who presented with acute abdominal pain.    CT of the abdomen and pelvis showed cholelithiasis within the cystic duct as well as the distal common bile duct with prominence of the common bile duct measuring 15 mm. Patient was admitted to the hospital working diagnosis cholelithiasis complicated by choledocholithiasis.  GI were consulted for ERCP.       Assessment & Plan:   Choledocholithiasis complicated by ascending cholangitis (present on admission), and acute liver injury S/p ERCP and CBD stent GI consulted and underwent ERCP,  due to obstructive stone, stent was placed in the common bile duct, performed sphincterectomy.   -Continue IV Zosyn, day 4 -Lap chole planned 1/24 once off plavix for 5 days  -Will need follow-up with Dr. Elnoria HowardHung to schedule stent removal -Consult General Surgery    Ischemic cardiomyopathy Chronic systolic CHF EF 35 to 40%.  Appears euvolemic. Appreciate cardiology consultation for preop cardiac clearance -Continue Entresto, Spironolactone, carvedilol, aspirin -Hold Plavix -Hold Lipitor given elevated LFTs  Hypertension -Continue amlodipine, dose reduced by Cardiology -Continue BB, spironolactone, Entresto  Elevated liver enzymes Improved today -Trend LFTs  Chronic kidney disease stage III Baseline serum creatinine 1.7-1.9.  [facility].  Remains stable relative to baseline  OSA -Continue CPAP  Other medications -Continue allopurinol -Continue famotidine          MDM and disposition: The below labs and imaging reports were reviewed and summarized above.  Medication management as above.  The patient was admitted with choledocholithiasis.  Now sp ERCP and  spincterotomy and stent placement.  Plan for lap chole tomorrow.           DVT prophylaxis: SCDs Code Status: FULL Family Communication: None present    Consultants:   GI   Gneeral Surgery  Cardiology  Procedures:   ERCP  Antimicrobials:   ZOsyn 1/20 >>   Subjective: No epigastric pain, no nausea or vomiting with eating.  No dyspnea with ambulation, no orthopnea, no leg swelling, no chest pain.  No fever, cough.  No confusion.  Objective: Vitals:   08/27/18 1343 08/27/18 2101 08/28/18 0345 08/28/18 0609  BP: 102/69 103/60 111/74   Pulse: (!) 59 (!) 55 (!) 56   Resp: 20 14 14    Temp: (!) 97.5 F (36.4 C) 97.8 F (36.6 C) 98 F (36.7 C)   TempSrc: Oral  Oral   SpO2: 99% 96% 99%   Weight:    79.4 kg  Height:        Intake/Output Summary (Last 24 hours) at 08/28/2018 1058 Last data filed at 08/28/2018 0700 Gross per 24 hour  Intake 1240 ml  Output 3600 ml  Net -2360 ml   Filed Weights   08/27/18 0418 08/27/18 1048 08/28/18 0609  Weight: 80.1 kg 78.2 kg 79.4 kg    Examination: General appearance:  adult male, alert and in no acute distress.   HEENT: icteric, conjunctiva pink, lids and lashes normal. No nasal deformity, discharge, epistaxis.  Lips moist.   Skin: Warm and dry.  no jaundice.  No suspicious rashes or lesions. Cardiac: RRR, nl S1-S2, no murmurs appreciated.  Capillary refill is brisk.  JVP not visible.  No LE edema.  Radial pulses 2+ and symmetric. Respiratory: Normal respiratory rate and rhythm.  CTAB without rales or wheezes. Abdomen: Abdomen soft.  no TTP or guarding. No ascites, distension, hepatosplenomegaly.   MSK: No deformities or effusions. Neuro: Awake and alert.  EOMI, moves all extremities. Speech fluent.   Gait normal Psych: Sensorium intact and responding to questions, attention normal. Affect normal.  Judgment and insight appear normal.    Data Reviewed: I have personally reviewed following labs and imaging  studies:  CBC: Recent Labs  Lab 08/24/18 2336 08/25/18 0417 08/26/18 0234 08/27/18 0152 08/28/18 0319  WBC 10.8* 12.4* 11.6* 15.7* 12.7*  NEUTROABS  --   --  11.1* 14.6*  --   HGB 14.1 13.6 13.8 13.5 14.2  HCT 43.8 43.0 42.1 41.7 42.9  MCV 81.1 81.0 79.9* 79.4* 78.7*  PLT 125* 112* 110* 112* 144*   Basic Metabolic Panel: Recent Labs  Lab 08/24/18 1530 08/24/18 2336 08/25/18 0417 08/26/18 0234 08/27/18 0152 08/28/18 0319  NA 138  --  137 139 137 136  K 4.5  --  3.7 4.2 3.8 3.7  CL 104  --  106 109 110 109  CO2 22  --  19* 21* 18* 20*  GLUCOSE 133*  --  137* 158* 152* 122*  BUN 27*  --  26* 19 23 27*  CREATININE 1.76* 1.99* 1.97* 1.73* 1.64* 1.69*  CALCIUM 9.3  --  8.1* 8.2* 8.0* 8.4*   GFR: Estimated Creatinine Clearance: 43.1 mL/min (A) (by C-G formula based on SCr of 1.69 mg/dL (H)). Liver Function Tests: Recent Labs  Lab 08/24/18 1530 08/25/18 0417 08/26/18 0234 08/27/18 0152 08/28/18 0319  AST 385* 865* 242* 107* 56*  ALT 167* 907* 532* 339* 234*  ALKPHOS 160* 201* 163* 150* 154*  BILITOT 3.3* 4.7* 5.1* 2.0* 1.2  PROT 7.6 5.8* 6.0* 5.8* 5.6*  ALBUMIN 4.3 3.2* 3.0* 2.9* 2.8*   Recent Labs  Lab 08/24/18 1530 08/26/18 0234  LIPASE 50 25   No results for input(s): AMMONIA in the last 168 hours. Coagulation Profile: Recent Labs  Lab 08/24/18 1530  INR 1.06   Cardiac Enzymes: No results for input(s): CKTOTAL, CKMB, CKMBINDEX, TROPONINI in the last 168 hours. BNP (last 3 results) No results for input(s): PROBNP in the last 8760 hours. HbA1C: No results for input(s): HGBA1C in the last 72 hours. CBG: No results for input(s): GLUCAP in the last 168 hours. Lipid Profile: No results for input(s): CHOL, HDL, LDLCALC, TRIG, CHOLHDL, LDLDIRECT in the last 72 hours. Thyroid Function Tests: No results for input(s): TSH, T4TOTAL, FREET4, T3FREE, THYROIDAB in the last 72 hours. Anemia Panel: No results for input(s): VITAMINB12, FOLATE, FERRITIN, TIBC,  IRON, RETICCTPCT in the last 72 hours. Urine analysis: No results found for: COLORURINE, APPEARANCEUR, LABSPEC, PHURINE, GLUCOSEU, HGBUR, BILIRUBINUR, KETONESUR, PROTEINUR, UROBILINOGEN, NITRITE, LEUKOCYTESUR Sepsis Labs: @LABRCNTIP (procalcitonin:4,lacticacidven:4)  )No results found for this or any previous visit (from the past 240 hour(s)).       Radiology Studies: No results found.      Scheduled Meds: . allopurinol  150 mg Oral Daily  . amLODipine  2.5 mg Oral Daily  . aspirin EC  81 mg Oral Daily  . carvedilol  25 mg Oral BID  . famotidine  10 mg Oral Daily  . omega-3 acid ethyl esters  2 g Oral BID  . sacubitril-valsartan  1 tablet Oral BID  . spironolactone  25 mg Oral Daily   Continuous Infusions: . piperacillin-tazobactam (ZOSYN)  IV 3.375 g (08/28/18 0519)  LOS: 4 days    Time spent: 25 minutes    Alberteen Samhristopher P Eilleen Davoli, MD [facility] 08/28/2018, 10:58 AM     Please page through AMION:  [email or web address] If 7PM-7AM, please contact night-coverage

## 2018-08-28 NOTE — Progress Notes (Addendum)
Progress Note  Patient Name: Daniel Hurst Date of Encounter: 08/28/2018  Primary Cardiologist: Nanetta BattyJonathan Berry, MD   Subjective   Doing ok. Out of bed and in a chair eating breakfast. Denies Cp and dyspnea.   Inpatient Medications    Scheduled Meds: . allopurinol  150 mg Oral Daily  . amLODipine  2.5 mg Oral Daily  . aspirin EC  81 mg Oral Daily  . carvedilol  25 mg Oral BID  . famotidine  10 mg Oral Daily  . omega-3 acid ethyl esters  2 g Oral BID  . sacubitril-valsartan  1 tablet Oral BID  . spironolactone  25 mg Oral Daily   Continuous Infusions: . piperacillin-tazobactam (ZOSYN)  IV 3.375 g (08/28/18 0519)   PRN Meds: cycloSPORINE, HYDROmorphone (DILAUDID) injection, nitroGLYCERIN, ondansetron   Vital Signs    Vitals:   08/27/18 1343 08/27/18 2101 08/28/18 0345 08/28/18 0609  BP: 102/69 103/60 111/74   Pulse: (!) 59 (!) 55 (!) 56   Resp: 20 14 14    Temp: (!) 97.5 F (36.4 C) 97.8 F (36.6 C) 98 F (36.7 C)   TempSrc: Oral  Oral   SpO2: 99% 96% 99%   Weight:    79.4 kg  Height:        Intake/Output Summary (Last 24 hours) at 08/28/2018 [id number] Last data filed at 08/27/2018 2300 Gross per 24 hour  Intake 1950 ml  Output 1600 ml  Net 350 ml   Last 3 Weights 08/28/2018 08/27/2018 08/27/2018  Weight (lbs) 175 lb 0.7 oz 172 lb 6.4 oz 176 lb 9.4 oz  Weight (kg) 79.4 kg 78.2 kg 80.1 kg      Telemetry    Not on tele - Personally Reviewed  ECG    SR w/ RBBB - Personally Reviewed  Physical Exam   GEN: middle aged asian male, in No acute distress.   Neck: No JVD Cardiac: RRR, no murmurs, rubs, or gallops.  Respiratory: Clear to auscultation bilaterally. GI: Soft, nontender, non-distended  MS: No edema; No deformity. Neuro:  Nonfocal  Psych: Normal affect   Labs    Chemistry Recent Labs  Lab 08/26/18 0234 08/27/18 0152 08/28/18 0319  NA 139 137 136  K 4.2 3.8 3.7  CL 109 110 109  CO2 21* 18* 20*  GLUCOSE 158* 152* 122*  BUN 19 23 27*    CREATININE 1.73* 1.64* 1.69*  CALCIUM 8.2* 8.0* 8.4*  PROT 6.0* 5.8* 5.6*  ALBUMIN 3.0* 2.9* 2.8*  AST 242* 107* 56*  ALT 532* 339* 234*  ALKPHOS 163* 150* 154*  BILITOT 5.1* 2.0* 1.2  GFRNONAA 40* 43* 41*  GFRAA 47* 50* 48*  ANIONGAP 9 9 7      Hematology Recent Labs  Lab 08/26/18 0234 08/27/18 0152 08/28/18 0319  WBC 11.6* 15.7* 12.7*  RBC 5.27 5.25 5.45  HGB 13.8 13.5 14.2  HCT 42.1 41.7 42.9  MCV 79.9* 79.4* 78.7*  MCH 26.2 25.7* 26.1  MCHC 32.8 32.4 33.1  RDW 14.1 14.1 14.1  PLT 110* 112* 144*    Cardiac EnzymesNo results for input(s): TROPONINI in the last 168 hours.  Recent Labs  Lab 08/24/18 1532  TROPIPOC 0.01     BNPNo results for input(s): BNP, PROBNP in the last 168 hours.   DDimer No results for input(s): DDIMER in the last 168 hours.   Radiology    No results found.  Cardiac Studies   ECHO 1/14  Study Conclusions  - Left ventricle: The cavity size was  normal. There was mild focal basal hypertrophy of the septum. Systolic function was moderately to severely reduced. The estimated ejection fraction was in the range of 30% to 35%. Diffuse hypokinesis. There is akinesis of the inferolateral and inferior myocardium. Doppler parameters are consistent with abnormal left ventricular relaxation (grade 1 diastolic dysfunction). - Aortic valve: There was mild regurgitation. - Aortic root: The aortic root was mildly dilated. - Ascending aorta: The ascending aorta was mildly dilated.   Patient Profile     68 y.o. male with a hx of CAD, chronic systolic CHF/ischemic cardiomyopathy, CKD stage III,OSA on CPAP, hyperlipidemia, mild aortic insufficiency, ascending aortic aneurysmwho is being seen for preoperative evaluationat the request of Dr. Ella Jubilee.  Assessment & Plan   1. Ischemic Cardiomyopathy: Followed by the advanced heart failure clinic. Long standing severely decreased EF since MI in 2012 with EF 35%. Most recent echo on  08/19/18 showed stable EF 30-35% with known wall motion abnormalities. Medically managed on with Entresto, carvedilol and Spironolactone. Recommend judicious use of IVFs during perioperative period. Monitor for volume overload. Strict I/Os.    2. CAD: The patient had an MI in 2012 at which time cath showed EF 25% with occluded LAD and RCA, no intervention done. Plavix currently on hold for surgery. Plan to resume once safe to do so from a surgical standpoint. He remains on ASA and  blocker and is CP free.   3. Choledocholithiasis: s/p ERCP on 08/25/18 w/ biliary sphincterotomy and stent placement. Plan is for OR after Plavix washout. Surgery scheduled for tomorrow.   4. CKD, Stage III: Overall improved. SCr peaked at 1.99 this admission. Has been trending downward. SCr stable over the last 24 hrs at 1.69.   For questions or updates, please contact [facility] Please consult [email or web address] for contact info under      Signed, Robbie Lis, PA-C  08/28/2018, 8:07 AM    History and all data above reviewed.   I agree with the findings as above.   No events overnight.  Creat stable and improved from admission.  His Norvasc was reduced.  He has been receiving and tolerating his Entresto, beta blocker and spironolactone.  He has not had a BM in two days but does not feel constipated.  Lungs clear.  Continue current therapy.   Rollene Rotunda  9:57 AM  08/28/2018

## 2018-08-29 ENCOUNTER — Encounter (HOSPITAL_COMMUNITY)
Admission: EM | Disposition: A | Discharge status: Home / Self Care | Payer: Self-pay | Source: Home / Self Care | Attending: Internal Medicine

## 2018-08-29 ENCOUNTER — Inpatient Hospital Stay (HOSPITAL_COMMUNITY): Payer: 59 | Admitting: Certified Registered Nurse Anesthetist

## 2018-08-29 DIAGNOSIS — K8309 Other cholangitis: Secondary | ICD-10-CM

## 2018-08-29 DIAGNOSIS — E785 Hyperlipidemia, unspecified: Secondary | ICD-10-CM

## 2018-08-29 DIAGNOSIS — I255 Ischemic cardiomyopathy: Secondary | ICD-10-CM

## 2018-08-29 DIAGNOSIS — R945 Abnormal results of liver function studies: Secondary | ICD-10-CM

## 2018-08-29 DIAGNOSIS — I10 Essential (primary) hypertension: Secondary | ICD-10-CM

## 2018-08-29 DIAGNOSIS — K8050 Calculus of bile duct without cholangitis or cholecystitis without obstruction: Secondary | ICD-10-CM

## 2018-08-29 DIAGNOSIS — K8070 Calculus of gallbladder and bile duct without cholecystitis without obstruction: Secondary | ICD-10-CM

## 2018-08-29 HISTORY — PX: CHOLECYSTECTOMY: SHX55

## 2018-08-29 LAB — CBC
HCT: 45.6 % (ref 39.0–52.0)
Hemoglobin: 14.6 g/dL (ref 13.0–17.0)
MCH: 25.2 pg — ABNORMAL LOW (ref 26.0–34.0)
MCHC: 32 g/dL (ref 30.0–36.0)
MCV: 78.6 fL — ABNORMAL LOW (ref 80.0–100.0)
Platelets: 127 10*3/uL — ABNORMAL LOW (ref 150–400)
RBC: 5.8 MIL/uL (ref 4.22–5.81)
RDW: 14 % (ref 11.5–15.5)
WBC: 8.5 10*3/uL (ref 4.0–10.5)
nRBC: 0 % (ref 0.0–0.2)

## 2018-08-29 LAB — COMPREHENSIVE METABOLIC PANEL
ALT: 183 U/L — ABNORMAL HIGH (ref 0–44)
AST: 57 U/L — ABNORMAL HIGH (ref 15–41)
Albumin: 2.8 g/dL — ABNORMAL LOW (ref 3.5–5.0)
Alkaline Phosphatase: 152 U/L — ABNORMAL HIGH (ref 38–126)
Anion gap: 8 (ref 5–15)
BUN: 28 mg/dL — ABNORMAL HIGH (ref 8–23)
CALCIUM: 8.7 mg/dL — AB (ref 8.9–10.3)
CO2: 21 mmol/L — ABNORMAL LOW (ref 22–32)
Chloride: 108 mmol/L (ref 98–111)
Creatinine, Ser: 1.76 mg/dL — ABNORMAL HIGH (ref 0.61–1.24)
GFR calc non Af Amer: 39 mL/min — ABNORMAL LOW (ref >60)
GFR, EST AFRICAN AMERICAN: 46 mL/min — AB (ref >60)
Glucose, Bld: 116 mg/dL — ABNORMAL HIGH (ref 70–99)
Potassium: 3.9 mmol/L (ref 3.5–5.1)
Sodium: 137 mmol/L (ref 135–145)
Total Bilirubin: 1.6 mg/dL — ABNORMAL HIGH (ref 0.3–1.2)
Total Protein: 5.4 g/dL — ABNORMAL LOW (ref 6.5–8.1)

## 2018-08-29 LAB — SURGICAL PCR SCREEN
MRSA, PCR: NEGATIVE
Staphylococcus aureus: POSITIVE — AB

## 2018-08-29 SURGERY — LAPAROSCOPIC CHOLECYSTECTOMY
Anesthesia: General | Site: Abdomen

## 2018-08-29 MED ORDER — BUPIVACAINE-EPINEPHRINE 0.25% -1:200000 IJ SOLN
INTRAMUSCULAR | Status: AC
  Filled 2018-08-29: qty 1

## 2018-08-29 MED ORDER — ACETAMINOPHEN 500 MG PO TABS
ORAL_TABLET | ORAL | Status: AC
  Filled 2018-08-29: qty 2

## 2018-08-29 MED ORDER — SUGAMMADEX SODIUM 200 MG/2ML IV SOLN
INTRAVENOUS | Status: DC | PRN
  Administered 2018-08-29: 152.6 mg via INTRAVENOUS

## 2018-08-29 MED ORDER — SODIUM CHLORIDE 0.9 % IV SOLN
INTRAVENOUS | Status: DC | PRN
  Administered 2018-08-29: 20 ug/min via INTRAVENOUS

## 2018-08-29 MED ORDER — METOCLOPRAMIDE HCL 5 MG/ML IJ SOLN: 10.0000 mg | Freq: Once | INTRAMUSCULAR | Status: DC | PRN

## 2018-08-29 MED ORDER — FENTANYL CITRATE (PF) 100 MCG/2ML IJ SOLN
INTRAMUSCULAR | Status: DC | PRN
  Administered 2018-08-29: 100 ug via INTRAVENOUS

## 2018-08-29 MED ORDER — FENTANYL CITRATE (PF) 250 MCG/5ML IJ SOLN
INTRAMUSCULAR | Status: AC
  Filled 2018-08-29: qty 5

## 2018-08-29 MED ORDER — PROPOFOL 10 MG/ML IV BOLUS
INTRAVENOUS | Status: DC | PRN
  Administered 2018-08-29: 150 mg via INTRAVENOUS
  Administered 2018-08-29: 20 mg via INTRAVENOUS

## 2018-08-29 MED ORDER — ROCURONIUM BROMIDE 10 MG/ML (PF) SYRINGE
PREFILLED_SYRINGE | INTRAVENOUS | Status: DC | PRN
  Administered 2018-08-29: 50 mg via INTRAVENOUS

## 2018-08-29 MED ORDER — LACTATED RINGERS IV SOLN
INTRAVENOUS | Status: DC | PRN
  Administered 2018-08-29: 07:00:00 via INTRAVENOUS

## 2018-08-29 MED ORDER — STERILE WATER FOR IRRIGATION IR SOLN
Status: DC | PRN
  Administered 2018-08-29: 1000 mL

## 2018-08-29 MED ORDER — PROPOFOL 10 MG/ML IV BOLUS
INTRAVENOUS | Status: AC
  Filled 2018-08-29: qty 20

## 2018-08-29 MED ORDER — MEPERIDINE HCL 50 MG/ML IJ SOLN: 6.2500 mg | INTRAMUSCULAR | Status: DC | PRN

## 2018-08-29 MED ORDER — DEXAMETHASONE SODIUM PHOSPHATE 10 MG/ML IJ SOLN
INTRAMUSCULAR | Status: DC | PRN
  Administered 2018-08-29: 10 mg via INTRAVENOUS

## 2018-08-29 MED ORDER — ACETAMINOPHEN 325 MG PO TABS
650.0000 mg | ORAL_TABLET | ORAL | Status: DC
  Administered 2018-08-29 – 2018-08-30 (×4): 650 mg via ORAL
  Filled 2018-08-29 (×3): qty 2

## 2018-08-29 MED ORDER — BUPIVACAINE-EPINEPHRINE 0.25% -1:200000 IJ SOLN
INTRAMUSCULAR | Status: DC | PRN
  Administered 2018-08-29: 20 mL

## 2018-08-29 MED ORDER — IOPAMIDOL (ISOVUE-300) INJECTION 61%
INTRAVENOUS | Status: AC
  Filled 2018-08-29: qty 50

## 2018-08-29 MED ORDER — LIDOCAINE 2% (20 MG/ML) 5 ML SYRINGE
INTRAMUSCULAR | Status: AC
  Filled 2018-08-29: qty 5

## 2018-08-29 MED ORDER — SODIUM CHLORIDE 0.9 % IR SOLN
Status: DC | PRN
  Administered 2018-08-29: 1000 mL

## 2018-08-29 MED ORDER — TRAMADOL HCL 50 MG PO TABS
50.0000 mg | ORAL_TABLET | Freq: Four times a day (QID) | ORAL | Status: DC | PRN
  Administered 2018-08-29 – 2018-08-30 (×2): 50 mg via ORAL
  Filled 2018-08-29 (×2): qty 1

## 2018-08-29 MED ORDER — LIDOCAINE 2% (20 MG/ML) 5 ML SYRINGE
INTRAMUSCULAR | Status: DC | PRN
  Administered 2018-08-29: 20 mg via INTRAVENOUS
  Administered 2018-08-29: 80 mg via INTRAVENOUS

## 2018-08-29 MED ORDER — FENTANYL CITRATE (PF) 100 MCG/2ML IJ SOLN: 25.0000 ug | INTRAMUSCULAR | Status: DC | PRN

## 2018-08-29 MED ORDER — HYDROMORPHONE HCL 1 MG/ML IJ SOLN: 1.0000 mg | INTRAMUSCULAR | Status: DC | PRN

## 2018-08-29 MED ORDER — ONDANSETRON HCL 4 MG/2ML IJ SOLN
INTRAMUSCULAR | Status: AC
  Filled 2018-08-29: qty 2

## 2018-08-29 MED ORDER — ONDANSETRON HCL 4 MG/2ML IJ SOLN
INTRAMUSCULAR | Status: DC | PRN
  Administered 2018-08-29: 4 mg via INTRAVENOUS

## 2018-08-29 MED ORDER — DEXAMETHASONE SODIUM PHOSPHATE 10 MG/ML IJ SOLN
INTRAMUSCULAR | Status: AC
  Filled 2018-08-29: qty 1

## 2018-08-29 MED ORDER — HEMOSTATIC AGENTS (NO CHARGE) OPTIME
TOPICAL | Status: DC | PRN
  Administered 2018-08-29: 1 via TOPICAL

## 2018-08-29 MED ORDER — 0.9 % SODIUM CHLORIDE (POUR BTL) OPTIME
TOPICAL | Status: DC | PRN
  Administered 2018-08-29: 1000 mL

## 2018-08-29 MED ORDER — MIDAZOLAM HCL 2 MG/2ML IJ SOLN
INTRAMUSCULAR | Status: AC
  Filled 2018-08-29: qty 2

## 2018-08-29 MED ORDER — ROCURONIUM BROMIDE 50 MG/5ML IV SOSY
PREFILLED_SYRINGE | INTRAVENOUS | Status: AC
  Filled 2018-08-29: qty 5

## 2018-08-29 SURGICAL SUPPLY — 41 items
APPLIER CLIP 5 13 M/L LIGAMAX5 (MISCELLANEOUS) ×4
BLADE CLIPPER SURG (BLADE) IMPLANT
CANISTER SUCT 3000ML PPV (MISCELLANEOUS) ×4 IMPLANT
CHLORAPREP W/TINT 26ML (MISCELLANEOUS) ×4 IMPLANT
CLIP APPLIE 5 13 M/L LIGAMAX5 (MISCELLANEOUS) ×2 IMPLANT
COVER MAYO STAND STRL (DRAPES) IMPLANT
COVER SURGICAL LIGHT HANDLE (MISCELLANEOUS) ×4 IMPLANT
COVER WAND RF STERILE (DRAPES) IMPLANT
DERMABOND ADVANCED (GAUZE/BANDAGES/DRESSINGS) ×2
DERMABOND ADVANCED .7 DNX12 (GAUZE/BANDAGES/DRESSINGS) ×2 IMPLANT
DISSECTOR BLUNT TIP ENDO 5MM (MISCELLANEOUS) ×4 IMPLANT
DRAPE C-ARM 42X72 X-RAY (DRAPES) IMPLANT
ELECT CAUTERY BLADE 6.4 (BLADE) ×4 IMPLANT
ELECT REM PT RETURN 9FT ADLT (ELECTROSURGICAL) ×4
ELECTRODE REM PT RTRN 9FT ADLT (ELECTROSURGICAL) ×2 IMPLANT
GLOVE BIO SURGEON STRL SZ7.5 (GLOVE) ×4 IMPLANT
GLOVE INDICATOR 8.0 STRL GRN (GLOVE) ×4 IMPLANT
GOWN STRL REUS W/ TWL LRG LVL3 (GOWN DISPOSABLE) ×4 IMPLANT
GOWN STRL REUS W/ TWL XL LVL3 (GOWN DISPOSABLE) ×2 IMPLANT
GOWN STRL REUS W/TWL LRG LVL3 (GOWN DISPOSABLE) ×4
GOWN STRL REUS W/TWL XL LVL3 (GOWN DISPOSABLE) ×2
HEMOSTAT SNOW SURGICEL 2X4 (HEMOSTASIS) ×4 IMPLANT
KIT BASIN OR (CUSTOM PROCEDURE TRAY) ×4 IMPLANT
KIT TURNOVER KIT B (KITS) ×4 IMPLANT
NS IRRIG 1000ML POUR BTL (IV SOLUTION) ×4 IMPLANT
PAD ARMBOARD 7.5X6 YLW CONV (MISCELLANEOUS) ×4 IMPLANT
PENCIL BUTTON HOLSTER BLD 10FT (ELECTRODE) ×4 IMPLANT
POUCH SPECIMEN RETRIEVAL 10MM (ENDOMECHANICALS) IMPLANT
SCISSORS LAP 5X35 DISP (ENDOMECHANICALS) ×4 IMPLANT
SET CHOLANGIOGRAPH 5 50 .035 (SET/KITS/TRAYS/PACK) IMPLANT
SET IRRIG TUBING LAPAROSCOPIC (IRRIGATION / IRRIGATOR) ×4 IMPLANT
SET TUBE SMOKE EVAC HIGH FLOW (TUBING) ×4 IMPLANT
SLEEVE ENDOPATH XCEL 5M (ENDOMECHANICALS) ×8 IMPLANT
SPECIMEN JAR SMALL (MISCELLANEOUS) ×4 IMPLANT
SUT MNCRL AB 4-0 PS2 18 (SUTURE) ×4 IMPLANT
TOWEL OR 17X24 6PK STRL BLUE (TOWEL DISPOSABLE) ×4 IMPLANT
TOWEL OR 17X26 10 PK STRL BLUE (TOWEL DISPOSABLE) ×4 IMPLANT
TRAY LAPAROSCOPIC MC (CUSTOM PROCEDURE TRAY) ×4 IMPLANT
TROCAR XCEL BLUNT TIP 100MML (ENDOMECHANICALS) ×4 IMPLANT
TROCAR XCEL NON-BLD 5MMX100MML (ENDOMECHANICALS) ×4 IMPLANT
WATER STERILE IRR 1000ML POUR (IV SOLUTION) ×4 IMPLANT

## 2018-08-29 NOTE — Anesthesia Procedure Notes (Signed)
Procedure Name: Intubation Performed by: Milford Cage, CRNA Pre-anesthesia Checklist: Patient identified, Emergency Drugs available, Suction available and Patient being monitored Patient Re-evaluated:Patient Re-evaluated prior to induction Oxygen Delivery Method: Circle System Utilized Preoxygenation: Pre-oxygenation with 100% oxygen Induction Type: IV induction Ventilation: Oral airway inserted - appropriate to patient size and Two handed mask ventilation required Laryngoscope Size: Mac and 3 Grade View: Grade III Tube type: Oral Tube size: 7.5 mm Number of attempts: 1 Airway Equipment and Method: Stylet and Oral airway Placement Confirmation: ETT inserted through vocal cords under direct vision,  positive ETCO2 and breath sounds checked- equal and bilateral Secured at: 23 cm Tube secured with: Tape Dental Injury: Injury to lip and Teeth and Oropharynx as per pre-operative assessment  Comments: Small lip laceration on lower lip left side from oral airway

## 2018-08-29 NOTE — Plan of Care (Signed)
  Problem: Clinical Measurements: Goal: Ability to maintain clinical measurements within normal limits will improve Outcome: Progressing Goal: Respiratory complications will improve Outcome: Not Applicable Goal: Cardiovascular complication will be avoided Outcome: Progressing   Problem: Activity: Goal: Risk for activity intolerance will decrease Outcome: Progressing   Problem: Coping: Goal: Level of anxiety will decrease Outcome: Progressing   Problem: Elimination: Goal: Will not experience complications related to bowel motility Outcome: Progressing

## 2018-08-29 NOTE — Progress Notes (Signed)
Patient ID: Daniel Hurst, male   DOB: 1951-08-06, 68 y.o.   MRN: [id number]    Day of Surgery  Subjective: Patient with no complaints this morning; ready for surgery.  Denies abdominal pain  No prior abdominal surgical hx per patient  Objective: Vital signs in last 24 hours: Temp:  [97.9 F (36.6 C)-98.2 F (36.8 C)] 98.2 F (36.8 C) (01/24 0517) Pulse Rate:  [55-64] 56 (01/24 0517) Resp:  [14-20] 20 (01/24 0517) BP: (107-117)/(73-85) 117/85 (01/24 0517) SpO2:  [97 %-98 %] 98 % (01/24 0517) Weight:  [76.3 kg] 76.3 kg (01/24 0523) Last BM Date: 08/26/18  Intake/Output from previous day: 01/23 0701 - 01/24 0700 In: 222 [P.O.:222] Out: 2625 [Urine:2625] Intake/Output this shift: No intake/output data recorded.  PE: Heart: regular Lungs: CTAB Abd: soft, NT, ND, +BS  Lab Results:  Recent Labs    08/28/18 0319 08/29/18 0329  WBC 12.7* 8.5  HGB 14.2 14.6  HCT 42.9 45.6  PLT 144* 127*   BMET Recent Labs    08/28/18 0319 08/29/18 0329  NA 136 137  K 3.7 3.9  CL 109 108  CO2 20* 21*  GLUCOSE 122* 116*  BUN 27* 28*  CREATININE 1.69* 1.76*  CALCIUM 8.4* 8.7*   PT/INR No results for input(s): LABPROT, INR in the last 72 hours. CMP     Component Value Date/Time   NA 137 08/29/2018 0329   NA 142 10/09/2017 0857   K 3.9 08/29/2018 0329   CL 108 08/29/2018 0329   CO2 21 (L) 08/29/2018 0329   GLUCOSE 116 (H) 08/29/2018 0329   BUN 28 (H) 08/29/2018 0329   BUN 26 10/09/2017 0857   CREATININE 1.76 (H) 08/29/2018 0329   CALCIUM 8.7 (L) 08/29/2018 0329   PROT 5.4 (L) 08/29/2018 0329   ALBUMIN 2.8 (L) 08/29/2018 0329   AST 57 (H) 08/29/2018 0329   ALT 183 (H) 08/29/2018 0329   ALKPHOS 152 (H) 08/29/2018 0329   BILITOT 1.6 (H) 08/29/2018 0329   GFRNONAA 39 (L) 08/29/2018 0329   GFRAA 46 (L) 08/29/2018 0329   Lipase     Component Value Date/Time   LIPASE 25 08/26/2018 0234       Studies/Results: No results found.  Anti-infectives: Anti-infectives  (From admission, onward)   Start     Dose/Rate Route Frequency Ordered Stop   08/25/18 2200  [MAR Hold]  piperacillin-tazobactam (ZOSYN) IVPB 3.375 g     (MAR Hold since Fri 08/29/2018 at 0653. Reason: Transfer to a Procedural area.)   3.375 g 12.5 mL/hr over 240 Minutes Intravenous Every 8 hours 08/25/18 1621         Assessment/Plan CAD with known occluded RCA and LAD H/o MI 2012 on Plavix (last dose1/19) CHF/ischemic cardiomyopathy (EF 30-35% 08/19/2018) CKD-III HLD HTN OSA Gout RBBB  Choledocholithiasis - s/p ERCP 1/20 by Dr. Elnoria Howard withbiliary sphincterotomyand one biliary stent was placed into the common bile duct.  -Friday will be 5 days off plavix.  Plan for OR today  ID -zosyn 1/20>> VTE -SCDs FEN -NPO Foley -none Follow up -TBD  -The anatomy and physiology of the hepatobiliary system was discussed at length with him and his daughter. The pathophysiology of gallbladder disease was discussed at length as well. -The options for treatment were discussed including observation which may result in subsequent gallbladder complications (infection, pancreatitis, choledocholithiasis, etc). Given choledocholithiasis, we discussed percutaneous options being subpar and unlikely to address the issue of stones passing into the bile duct. -We discussed cholecystectomy -  both laparoscopic as well as open techniques. We also discussed possibility of subtotal cholecystectomy and drain placement -The procedure, material risks (including, but not limited to, pain, bleeding, infection, scarring, need for blood transfusion, damage to surrounding structures- blood vessels/nerves/viscus/organs, damage to bile duct, bile leak, need for additional procedures, hernia, worsening of pre-existing medical conditions, pancreatitis, pneumonia, heart attack particularly in light of his history, stroke, death) benefits and alternatives to surgery were discussed at length. I noted a good probability that  the procedure would help improve his symptoms. The patient's and his daughter's questions were answered to their satisfaction, they voiced understanding and have elected to proceed with surgery. Additionally, we discussed typical postoperative expectations and the recovery process.   LOS: 5 days   Stephanie Coup. Cliffton Asters, M.D. [facility]

## 2018-08-29 NOTE — Progress Notes (Signed)
PROGRESS NOTE    Daniel Hurst   RUE:[id number]  DOB: 10-26-1950  DOA: 08/24/2018 PCP: Pearson GrippeKim, James, MD   Brief Narrative:  Daniel Hurst  is a 68 y.o. M with CAD, isch CM with chronic sCHF EF 30-35%, HTN, CKD III baseline Cr 1.7, and gout who presented with acute abdominal pain.    CT of the abdomen and pelvis showed cholelithiasis within the cystic duct as well as the distal common bile duct with prominence of the common bile duct measuring 15 mm. Patient was admitted to the hospital working diagnosis cholelithiasis complicated by choledocholithiasis.  GI were consulted for ERCP.   Subjective: See when he came out of the OR. A bit sleepy. No complaints of pain.     Assessment & Plan:   Principal Problem:   Cholelithiasis with choledocholithiasis - s/p ERCP 1/20, Dr Elnoria HowardHung, stone could not be retrieved- sphincterotomy and stenting done  - LFTs improving - general surgery consulted on 1/21, cardiology clearance obtained 1/21 - s/p lap chole today  Active Problems: Acute thrombocytopenia - ? Due to zosyn - follow    CAD (coronary artery disease):  cardiac catheterization June 2012 because of MI revealed occluded RCA and occluded LAD patent circumflex. - Plavix on hold since Sunday for surgery- resume once OK by General surgery -cont ASA and Statin    Cardiomyopathy, ischemic, EF by Echo 03/24/2012 35-40%   HTN (hypertension) - Amlodipine, Coreg, Entresto, Spironolactone -follow fluid status - BP slightly low- Coreg was held by RN this AM- will d/c Amlodipine     Gout - Allopurinol  OSA - CPAP at bedside- continue to use when asleep  CKD3 - Cr at baseline    Time spent in minutes: 35 DVT prophylaxis:   Code Status: full code Family Communication:  Disposition Plan: discharge per general surgery Consultants:   GI  General surgery  cardiology Procedures:  ERCP Impression:               - The major papilla appeared normal.                           - The middle  third of the main bile duct and lower                            third of the main bile duct were moderately                            dilated, with a stone causing an obstruction.                           - Choledocholithiasis was found. Removal by biliary                            sphincterotomy was not accomplished; a stent was                            inserted.                           - A biliary sphincterotomy was performed.                           -  One biliary stent was placed into the common bile                            duct.  Lap Cholecystectomy  Antimicrobials:  Anti-infectives (From admission, onward)   Start     Dose/Rate Route Frequency Ordered Stop   08/25/18 2200  [MAR Hold]  piperacillin-tazobactam (ZOSYN) IVPB 3.375 g     (MAR Hold since Fri 08/29/2018 at 0653. Reason: Transfer to a Procedural area.)   3.375 g 12.5 mL/hr over 240 Minutes Intravenous Every 8 hours 08/25/18 1621         Objective: Vitals:   08/28/18 2118 08/28/18 2156 08/29/18 0517 08/29/18 0523  BP: 111/73  117/85   Pulse: (!) 55 64 (!) 56   Resp: 18  20   Temp: 98 F (36.7 C)  98.2 F (36.8 C)   TempSrc:   Oral   SpO2: 97%  98%   Weight:    76.3 kg  Height:        Intake/Output Summary (Last 24 hours) at 08/29/2018 0817 Last data filed at 08/29/2018 0524 Gross per 24 hour  Intake 222 ml  Output 2625 ml  Net -2403 ml   Filed Weights   08/27/18 1048 08/28/18 0609 08/29/18 0523  Weight: 78.2 kg 79.4 kg 76.3 kg    Examination: General exam: Appears comfortable  HEENT: PERRLA, oral mucosa moist, no sclera icterus or thrush Respiratory system: Clear to auscultation. Respiratory effort normal. Cardiovascular system: S1 & S2 heard, RRR.   Gastrointestinal system: Abdomen soft,  nondistended. Normal bowel sounds. Incisions on abdomen noted.  Central nervous system: Alert and oriented. No focal neurological deficits. Extremities: No cyanosis, clubbing or edema Skin: No rashes  or ulcers Psychiatry:  Mood & affect appropriate.     Data Reviewed: I have personally reviewed following labs and imaging studies  CBC: Recent Labs  Lab 08/25/18 0417 08/26/18 0234 08/27/18 0152 08/28/18 0319 08/29/18 0329  WBC 12.4* 11.6* 15.7* 12.7* 8.5  NEUTROABS  --  11.1* 14.6*  --   --   HGB 13.6 13.8 13.5 14.2 14.6  HCT 43.0 42.1 41.7 42.9 45.6  MCV 81.0 79.9* 79.4* 78.7* 78.6*  PLT 112* 110* 112* 144* 127*   Basic Metabolic Panel: Recent Labs  Lab 08/25/18 0417 08/26/18 0234 08/27/18 0152 08/28/18 0319 08/29/18 0329  NA 137 139 137 136 137  K 3.7 4.2 3.8 3.7 3.9  CL 106 109 110 109 108  CO2 19* 21* 18* 20* 21*  GLUCOSE 137* 158* 152* 122* 116*  BUN 26* 19 23 27* 28*  CREATININE 1.97* 1.73* 1.64* 1.69* 1.76*  CALCIUM 8.1* 8.2* 8.0* 8.4* 8.7*   GFR: Estimated Creatinine Clearance: 38 mL/min (A) (by C-G formula based on SCr of 1.76 mg/dL (H)). Liver Function Tests: Recent Labs  Lab 08/25/18 0417 08/26/18 0234 08/27/18 0152 08/28/18 0319 08/29/18 0329  AST 865* 242* 107* 56* 57*  ALT 907* 532* 339* 234* 183*  ALKPHOS 201* 163* 150* 154* 152*  BILITOT 4.7* 5.1* 2.0* 1.2 1.6*  PROT 5.8* 6.0* 5.8* 5.6* 5.4*  ALBUMIN 3.2* 3.0* 2.9* 2.8* 2.8*   Recent Labs  Lab 08/24/18 1530 08/26/18 0234  LIPASE 50 25   No results for input(s): AMMONIA in the last 168 hours. Coagulation Profile: Recent Labs  Lab 08/24/18 1530  INR 1.06   Cardiac Enzymes: No results for input(s): CKTOTAL, CKMB, CKMBINDEX, TROPONINI in the last 168 hours. BNP (last  3 results) No results for input(s): PROBNP in the last 8760 hours. HbA1C: No results for input(s): HGBA1C in the last 72 hours. CBG: No results for input(s): GLUCAP in the last 168 hours. Lipid Profile: No results for input(s): CHOL, HDL, LDLCALC, TRIG, CHOLHDL, LDLDIRECT in the last 72 hours. Thyroid Function Tests: No results for input(s): TSH, T4TOTAL, FREET4, T3FREE, THYROIDAB in the last 72 hours. Anemia  Panel: No results for input(s): VITAMINB12, FOLATE, FERRITIN, TIBC, IRON, RETICCTPCT in the last 72 hours. Urine analysis: No results found for: COLORURINE, APPEARANCEUR, LABSPEC, PHURINE, GLUCOSEU, HGBUR, BILIRUBINUR, KETONESUR, PROTEINUR, UROBILINOGEN, NITRITE, LEUKOCYTESUR Sepsis Labs: @LABRCNTIP (procalcitonin:4,lacticidven:4) ) Recent Results (from the past 240 hour(s))  Surgical pcr screen     Status: Abnormal   Collection Time: 08/29/18  4:51 AM  Result Value Ref Range Status   MRSA, PCR NEGATIVE NEGATIVE Final   Staphylococcus aureus POSITIVE (A) NEGATIVE Final    Comment: (NOTE) The Xpert SA Assay (FDA approved for NASAL specimens in patients 67 years of age and older), is one component of a comprehensive surveillance program. It is not intended to diagnose infection nor to guide or monitor treatment. Performed at [facility], [address] [place], [place] [address]          Radiology Studies: No results found.    Scheduled Meds: . [MAR Hold] allopurinol  150 mg Oral Daily  . [MAR Hold] amLODipine  2.5 mg Oral Daily  . [MAR Hold] aspirin EC  81 mg Oral Daily  . [MAR Hold] carvedilol  25 mg Oral BID  . [MAR Hold] famotidine  10 mg Oral Daily  . [MAR Hold] omega-3 acid ethyl esters  2 g Oral BID  . [MAR Hold] sacubitril-valsartan  1 tablet Oral BID  . [MAR Hold] spironolactone  25 mg Oral Daily   Continuous Infusions: . [MAR Hold] piperacillin-tazobactam (ZOSYN)  IV 3.375 g (08/29/18 0446)     LOS: 5 days      Calvert CantorSaima Elba Dendinger, MD [facility] Pager: [email or web address] Password [email or web address] 08/29/2018, 8:17 AM

## 2018-08-29 NOTE — Transfer of Care (Signed)
Immediate Anesthesia Transfer of Care Note  Patient: Daniel Hurst  Procedure(s) Performed: LAPAROSCOPIC CHOLECYSTECTOMY (N/A Abdomen)  Patient Location: PACU  Anesthesia Type:General  Level of Consciousness: awake and alert   Airway & Oxygen Therapy: Patient Spontanous Breathing  Post-op Assessment: Report given to RN and Post -op Vital signs reviewed and stable  Post vital signs: Reviewed and stable  Last Vitals:  Vitals Value Taken Time  BP 126/65 08/29/2018  9:06 AM  Temp    Pulse 58 08/29/2018  9:10 AM  Resp 16 08/29/2018  9:10 AM  SpO2 94 % 08/29/2018  9:10 AM  Vitals shown include unvalidated device data.  Last Pain:  Vitals:   08/29/18 0517  TempSrc: Oral  PainSc:       Patients Stated Pain Goal: 2 (08/25/18 1453)  Complications: No apparent anesthesia complications

## 2018-08-29 NOTE — Op Note (Signed)
08/24/2018 - 08/29/2018 8:45 AM  PATIENT: Daniel Hurst  68 y.o. male  Patient Care Team: Pearson GrippeKim, James, MD as PCP - General (Internal Medicine) Runell GessBerry, Jonathan J, MD as PCP - Cardiology (Cardiology)  PRE-OPERATIVE DIAGNOSIS: History of choledocholithiasis  POST-OPERATIVE DIAGNOSIS: Same  PROCEDURE: Laparoscopic cholecystectomy  SURGEON: Stephanie CoupChristopher M. Cliffton AstersWhite, MD  ASSISTANT: Barnetta ChapelKelly Osborne PA-C   ANESTHESIA: General endotracheal  EBL: No intake/output data recorded.  DRAINS: None  SPECIMEN: Gallbladder  COUNTS: Sponge, needle and instrument counts were reported correct x2 at the conclusion of the operation  DISPOSITION: PACU in satisfactory condition  COMPLICATIONS: None  FINDINGS: Posteriorly based cystic artery.  Short cystic duct.  Given that he has had a prior ERCP with stent placement and intraoperatively had what was determined to be a short cystic duct, a cholangiogram was not performed so as to avoid a backwall injury to the common bile duct.  INDICATION: Mr. Daniel Hurst is a very pleasant 68 year old gentleman with a history of CAD (occluded RCA and LAD; prior MI in 2012) on Plavix, HTN, HLD, CKD III, CHF (EF 30-35%) who presented to the hospital with complaints of midepigastric and right upper quadrant abdominal pain that began 08/24/2018.  He had never had pain like this before.  He was evaluated fine to be focally tender in the right upper quadrant and underwent further evaluation.  His laboratory studies were significant for elevated LFTs as well as a total bilirubin of 5.1.  He underwent further evaluation with CT abdomen pelvis which demonstrated gallstones in the gallbladder as well as in the cystic and distal common bile duct.  He was admitted to the hospital.  He was seen by Dr. Elnoria HowardHung with gastroenterology and ultimately taken for ERCP.  This was completed successfully but given that he was on Plavix, a stent was intentionally left in place and a limited sphincterotomy was  performed.  He recovered from this and we waited 5 days for his Plavix to wear off.  We discussed options moving forward.  He was seen by cardiology and deemed to be a moderate risk for major perioperative cardiac events.  After discussing options with him, he opted to pursue surgery.  Please refer to notes elsewhere for details regarding his discussion.  DESCRIPTION:   The patient was identified & brought into the operating room. The patient was positioned supine on the OR table. SCDs were in place and active during the entire case. The patient underwent general endotracheal anesthesia. All pressure points were then padded. The abdomen was prepped and draped in the standard sterile fashion and antibiotics were administered. A surgical timeout was performed and confirmed our plan as well as antibiotics.  An infraumbilical incision was made. The umbilical stalk was grasped and retracted outwardly. The infraumbilical fascia was identified and incised. The peritoneal cavity was gently entered bluntly. A purse-string 0 Vicryl suture was placed. The Hasson cannula was inserted into the peritoneal cavity and insufflation with CO2 commenced to 15mmHg. A laparoscope was inserted into the peritoneal cavity and inspection confirmed no evidence of trocar site complications. The patient was then positioned in reverse Trendelenburg with the left side down. 3 additional 5mm trocars were placed along the right subcostal line - one 5mm port in mid subcostal region, another 5mm port in the right flank near the anterior axillary line, and a third 5mm port in the left subxiphoid region obliquely near the falciform ligament.  The liver and gallbladder were inspected. The gallbladder fundus was grasped and elevated cephalad. An additional  grasper was then placed on the infundibulum of the gallbladder and the infundibulum was retracted laterally. Gentle blunt dissection was then employed with a Art gallery manager[place] dissector working down  into ComcastCalot's triangle. The peritoneum on both sides of the gallbladder was opened with hook cautery. The cystic duct was identified and carefully circumferentially dissected. The cystic artery was then carefully circumferentially dissected. This was originating in the triangle of Calot but then running up onto the posterior side of the gallbladder consistent with a posterior cystic dominant circulation. The space between the cystic artery and hepatocystic plate was developed such that the liver could be seen through a window medial to the cystic artery. The triangle of Calot was cleared of all fibrofatty tissue. At this point, a critical view of safety was achieved and the only structures visualized was the skeletonized cystic duct laterally, the skeletonized cystic artery and the liver through the window medial to the artery.   His cystic duct appeared relatively short and since his duct had been previously cleared with ERCP and stent and his duct was short, the decision was made not to perform a cholangiogram so as to avoid risk of backwall injury to the common bile duct.  The cystic duct and artery were clipped with 2 clips on the "stay" side and 1 clip on the specimen side. The cystic duct and artery were then divided. The gallbladder was then freed from its remaining attachments to the liver using electrocautery. The cystic artery ran along the back side of the gallbladder - high up near the fundus and liver periphery it was additionally clipped with 2 clips. The gallbladder was then placed into an endocatch bag. Hemostasis was achieved and then re-verified. Given his history of plavix, Surgicel snow was placed in the gallbladder fossa. The rest of the abdomen was inspected no injury nor bleeding elsewhere was identified. The RUQ was irrigated with normal saline. The clips and gallbladder fossa were reinspected and noted to be in place and hemostatic.  The gallbladder and endocatch bag was then removed  from the umbilical port site and passed off as specimen. The RUQ ports were removed under direct visualization and noted to be hemostatic. The umbilical fascia was then closed using 0 Vicryl suture. The fascia was palpated and noted to be completely closed. The skin of all incision sites was approximated with 4-0 monocryl subcuticular suture and dermabond applied. The patient was then extubated and transferred to a stretcher for transport to PACU in satisfactory condition.

## 2018-08-29 NOTE — Anesthesia Preprocedure Evaluation (Signed)
Anesthesia Evaluation  Patient identified by MRN, date of birth, ID band Patient awake    Reviewed: Allergy & Precautions, H&P , NPO status , Patient's Chart, lab work & pertinent test results, reviewed documented beta blocker date and time   Airway Mallampati: II  TM Distance: >3 FB Neck ROM: Full    Dental no notable dental hx. (+) Dental Advisory Given   Pulmonary neg pulmonary ROS, former smoker,    Pulmonary exam normal breath sounds clear to auscultation       Cardiovascular hypertension, Pt. on medications and Pt. on home beta blockers + CAD, + Past MI and +CHF   Rhythm:Regular Rate:Normal     Neuro/Psych negative neurological ROS  negative psych ROS   GI/Hepatic negative GI ROS, Neg liver ROS,   Endo/Other  negative endocrine ROS  Renal/GU negative Renal ROS  negative genitourinary   Musculoskeletal   Abdominal   Peds  Hematology negative hematology ROS (+)   Anesthesia Other Findings   Reproductive/Obstetrics negative OB ROS                             Lab Results  Component Value Date   WBC 8.5 08/29/2018   HGB 14.6 08/29/2018   HCT 45.6 08/29/2018   MCV 78.6 (L) 08/29/2018   PLT 127 (L) 08/29/2018   Lab Results  Component Value Date   CREATININE 1.76 (H) 08/29/2018   BUN 28 (H) 08/29/2018   NA 137 08/29/2018   K 3.9 08/29/2018   CL 108 08/29/2018   CO2 21 (L) 08/29/2018    Anesthesia Physical  Anesthesia Plan  ASA: III  Anesthesia Plan: General   Post-op Pain Management:    Induction: Intravenous  PONV Risk Score and Plan: 3 and Ondansetron, Dexamethasone and Treatment may vary due to age or medical condition  Airway Management Planned: Oral ETT  Additional Equipment:   Intra-op Plan:   Post-operative Plan: Extubation in OR  Informed Consent: I have reviewed the patients History and Physical, chart, labs and discussed the procedure including the  risks, benefits and alternatives for the proposed anesthesia with the patient or authorized representative who has indicated his/her understanding and acceptance.     Dental advisory given  Plan Discussed with: CRNA  Anesthesia Plan Comments:         Anesthesia Quick Evaluation

## 2018-08-29 NOTE — Progress Notes (Signed)
   [facility] will sign off.   No apparent complications post op.  Call us with any questions.  Medication Recommendations:  Continue current therapy. Other recommendations (labs, testing, etc):   Follow up as an outpatient:  He has follow up scheduled with Dr. Shirlee Latch in March

## 2018-08-29 NOTE — Anesthesia Postprocedure Evaluation (Signed)
Anesthesia Post Note  Patient: Daniel Hurst  Procedure(s) Performed: LAPAROSCOPIC CHOLECYSTECTOMY (N/A Abdomen)     Patient location during evaluation: PACU Anesthesia Type: General Level of consciousness: awake and alert Pain management: pain level controlled Vital Signs Assessment: post-procedure vital signs reviewed and stable Respiratory status: spontaneous breathing, nonlabored ventilation, respiratory function stable and patient connected to nasal cannula oxygen Cardiovascular status: blood pressure returned to baseline and stable Postop Assessment: no apparent nausea or vomiting Anesthetic complications: no    Last Vitals:  Vitals:   08/29/18 1022 08/29/18 1039  BP: 127/67 113/75  Pulse: (!) 58 62  Resp: 15 20  Temp: (!) 36.3 C 36.6 C  SpO2: 97% 99%    Last Pain:  Vitals:   08/29/18 1039  TempSrc: Oral  PainSc:                  Phillips Grout

## 2018-08-30 ENCOUNTER — Encounter (HOSPITAL_COMMUNITY): Payer: Self-pay | Admitting: Surgery

## 2018-08-30 LAB — CBC
HCT: 49 % (ref 39.0–52.0)
Hemoglobin: 15.7 g/dL (ref 13.0–17.0)
MCH: 25.4 pg — AB (ref 26.0–34.0)
MCHC: 32 g/dL (ref 30.0–36.0)
MCV: 79.3 fL — ABNORMAL LOW (ref 80.0–100.0)
Platelets: 180 10*3/uL (ref 150–400)
RBC: 6.18 MIL/uL — AB (ref 4.22–5.81)
RDW: 14.3 % (ref 11.5–15.5)
WBC: 18 10*3/uL — ABNORMAL HIGH (ref 4.0–10.5)
nRBC: 0 % (ref 0.0–0.2)

## 2018-08-30 LAB — COMPREHENSIVE METABOLIC PANEL
ALT: 186 U/L — AB (ref 0–44)
AST: 56 U/L — ABNORMAL HIGH (ref 15–41)
Albumin: 3.1 g/dL — ABNORMAL LOW (ref 3.5–5.0)
Alkaline Phosphatase: 180 U/L — ABNORMAL HIGH (ref 38–126)
Anion gap: 12 (ref 5–15)
BUN: 27 mg/dL — ABNORMAL HIGH (ref 8–23)
CO2: 22 mmol/L (ref 22–32)
Calcium: 8.9 mg/dL (ref 8.9–10.3)
Chloride: 104 mmol/L (ref 98–111)
Creatinine, Ser: 1.58 mg/dL — ABNORMAL HIGH (ref 0.61–1.24)
GFR calc Af Amer: 52 mL/min — ABNORMAL LOW (ref >60)
GFR calc non Af Amer: 45 mL/min — ABNORMAL LOW (ref >60)
Glucose, Bld: 121 mg/dL — ABNORMAL HIGH (ref 70–99)
Potassium: 3.9 mmol/L (ref 3.5–5.1)
SODIUM: 138 mmol/L (ref 135–145)
Total Bilirubin: 1.7 mg/dL — ABNORMAL HIGH (ref 0.3–1.2)
Total Protein: 6.1 g/dL — ABNORMAL LOW (ref 6.5–8.1)

## 2018-08-30 MED ORDER — CLOPIDOGREL BISULFATE 75 MG PO TABS
75.0000 mg | ORAL_TABLET | Freq: Every day | ORAL | Status: DC
  Administered 2018-08-30: 75 mg via ORAL
  Filled 2018-08-30: qty 1

## 2018-08-30 MED ORDER — TRAMADOL HCL 50 MG PO TABS: 50.0000 mg | ORAL_TABLET | Freq: Four times a day (QID) | ORAL | 0 refills | Status: DC | PRN

## 2018-08-30 MED ORDER — POLYETHYLENE GLYCOL 3350 17 G PO PACK: 17.0000 g | PACK | Freq: Two times a day (BID) | ORAL | 0 refills | Status: DC | PRN

## 2018-08-30 MED ORDER — BISACODYL 10 MG RE SUPP: 10.0000 mg | Freq: Every day | RECTAL | 0 refills | Status: DC | PRN

## 2018-08-30 MED ORDER — BISACODYL 10 MG RE SUPP: 10.0000 mg | Freq: Every day | RECTAL | Status: DC | PRN

## 2018-08-30 NOTE — Discharge Summary (Signed)
Physician Discharge Summary  Daniel Hurst ZOX:[id number] DOB: 1950-12-28 DOA: 08/24/2018  PCP: Pearson GrippeKim, James, MD  Admit date: 08/24/2018 Discharge date: 08/30/2018  Admitted From: home Disposition:  home   Recommendations for Outpatient Follow-up:  1. Resume Amlodipine as needed    Discharge Condition:  stable   CODE STATUS:  Full code   Diet recommendation:  Low fat, heart healthy Consultations:  GI  General surgery  Cardiology    Discharge Diagnoses:  Principal Problem:   Cholelithiasis with choledocholithiasis Active Problems:   CAD (coronary artery disease):  cardiac catheterization June 2012 because of MI revealed occluded RCA and occluded LAD patent circumflex.   HTN (hypertension)   HLD (hyperlipidemia)   Cardiomyopathy, ischemic, EF by Echo 03/24/2012 35-40%   Abdominal pain   Gout      Brief Summary: Daniel BumpLinh Coopersmith is a 68 y.o.Mwith CAD, isch CM with chronic sCHF EF 30-35%, HTN, CKD III baseline Cr 1.7, and gout who presented with acute abdominal pain.  CT of the abdomen and pelvis showed cholelithiasis within the cystic duct as well as the distal common bile duct with prominence of the common bile duct measuring 15 mm. Patient was admitted to the hospital working diagnosis cholelithiasis complicated by choledocholithiasis.GI were consulted for ERCP.  Hospital Course:  Principal Problem:   Cholelithiasis with choledocholithiasis - s/p ERCP 1/20, Dr Elnoria HowardHung, stone could not be retrieved- sphincterotomy and stenting done  - LFTs improving - general surgery consulted on 1/21, cardiology clearance obtained 1/21 - s/p lap chole 1/24- stable per surgery to go home today. Plavix resumed and Pain medication, Tramadol prescribed by surgery.  Active Problems: Acute thrombocytopenia - ? Due to zosyn - has resolved  Elevated WBC today (17) - no signs of infection- likely stress induced from surgery    CAD (coronary artery disease):  cardiac catheterization June 2012  because of MI revealed occluded RCA and occluded LAD patent circumflex. - Plavix on hold since Sunday for surgery- resumed today per general surgery recommendations -cont ASA and Statin    Cardiomyopathy, ischemic, EF by Echo 03/24/2012 35-40%   HTN (hypertension) - home meds> Amlodipine, Coreg, Entresto, Spironolactone - BP was slightly low- have d/c'd Amlodipine for now    Gout - Allopurinol  OSA - CPAP at bedside- continue to use when asleep  CKD3 - Cr at baseline    Discharge Exam: Vitals:   08/29/18 1937 08/30/18 0520  BP: 112/76 128/74  Pulse: 65 (!) 59  Resp:    Temp: 97.7 F (36.5 C) 98.2 F (36.8 C)  SpO2: 98% 97%   Vitals:   08/29/18 1039 08/29/18 1937 08/30/18 0500 08/30/18 0520  BP: 113/75 112/76  128/74  Pulse: 62 65  (!) 59  Resp: 20     Temp: 97.9 F (36.6 C) 97.7 F (36.5 C)  98.2 F (36.8 C)  TempSrc: Oral Oral  Oral  SpO2: 99% 98%  97%  Weight:   76 kg 75.2 kg  Height:        General: Pt is alert, awake, not in acute distress Cardiovascular: RRR, S1/S2 +, no rubs, no gallops Respiratory: CTA bilaterally, no wheezing, no rhonchi Abdominal: Soft,  bowel sounds +, surgical incisions appear clean Extremities: no edema, no cyanosis   Discharge Instructions  Discharge Instructions    Diet - low sodium heart healthy   Complete by:  As directed    Increase activity slowly   Complete by:  As directed      Allergies as of 08/30/2018  Reactions   Ace Inhibitors Other (See Comments)   cough      Medication List    STOP taking these medications   amLODipine 5 MG tablet Commonly known as:  NORVASC     TAKE these medications   aspirin 81 MG tablet Take 1 tablet (81 mg total) by mouth daily.   atorvastatin 40 MG tablet Commonly known as:  LIPITOR TAKE 1 TABLET BY MOUTH  DAILY AT 6 PM. What changed:  See the new instructions.   bisacodyl 10 MG suppository Commonly known as:  DULCOLAX Place 1 suppository (10 mg total)  rectally daily as needed for moderate constipation.   carvedilol 25 MG tablet Commonly known as:  COREG TAKE 1 TABLET BY MOUTH TWO  TIMES DAILY   clopidogrel 75 MG tablet Commonly known as:  PLAVIX TAKE 1 TABLET BY MOUTH  DAILY   ENTRESTO 49-51 MG Generic drug:  sacubitril-valsartan TAKE ONE TABLET BY MOUTH TWICE A DAY   nitroGLYCERIN 0.4 MG SL tablet Commonly known as:  NITROSTAT Place 1 tablet (0.4 mg total) under the tongue every 5 (five) minutes as needed.   PEPCID AC PO Take 1 tablet by mouth daily.   polyethylene glycol packet Commonly known as:  MIRALAX / GLYCOLAX Take 17 g by mouth 2 (two) times daily as needed for mild constipation or severe constipation.   RESTASIS 0.05 % ophthalmic emulsion Generic drug:  cycloSPORINE Place 1 drop into both eyes daily as needed (dryness).   spironolactone 25 MG tablet Commonly known as:  ALDACTONE TAKE ONE TABLET BY MOUTH DAILY   traMADol 50 MG tablet Commonly known as:  ULTRAM Take 1 tablet (50 mg total) by mouth every 6 (six) hours as needed for severe pain.   TURMERIC PO Take 1 tablet by mouth daily.   VASCEPA 1 g Caps Generic drug:  Icosapent Ethyl Take 2 tablets by mouth 2 (two) times daily.   ZYLOPRIM 300 MG tablet Generic drug:  allopurinol Take 150 mg by mouth daily.      Follow-up Information    [facility] up on 09/16/2018.   Specialty:  General Surgery Why:  8:30am, arrive at 8:00am for paperwork and check in. please bring Photo ID and insurance card Contact information: [address] [place] [address] [phone]        Jeani HawkingHung, Patrick, MD Follow up.   Specialty:  Gastroenterology Why:  Call to confirm follow up regarding biliary stent Contact information: [address], [address] [place] [address] [phone]        Pearson GrippeKim, James, MD. Schedule an appointment as soon as possible for a visit in 1 week(s).   Specialty:  Internal Medicine Contact  information: [address] [place] [address] [phone]        Runell GessBerry, Jonathan J, MD .   Specialties:  Cardiology, Radiology Contact information: [address] [place] [address] [phone]          Allergies  Allergen Reactions  . Ace Inhibitors Other (See Comments)    cough     Procedures/Studies:  ERCP- Dr Elnoria HowardHung Impression:               - The major papilla appeared normal.                           - The middle third of the main bile duct and lower  third of the main bile duct were moderately                            dilated, with a stone causing an obstruction.                           - Choledocholithiasis was found. Removal by biliary                            sphincterotomy was not accomplished; a stent was                            inserted.                           - A biliary sphincterotomy was performed.                           - One biliary stent was placed into the common bile                            duct.  Laparoscopic cholecystectomy- Dr Cliffton Asters  Ct Abdomen Pelvis Wo Contrast  Result Date: 08/24/2018 CLINICAL DATA:  Epigastric and chest pain EXAM: CT ABDOMEN AND PELVIS WITHOUT CONTRAST TECHNIQUE: Multidetector CT imaging of the abdomen and pelvis was performed following the standard protocol without IV contrast. COMPARISON:  None. FINDINGS: Lower chest: Mild right basilar atelectasis is noted. Hepatobiliary: Liver is within normal limits. Gallbladder is well distended. There are gallstones identified in the region of the gallbladder neck as well as a density noted in the distal aspect of the common bile duct in the region of the head of the pancreas. This is consistent with a stone and likely the etiology of the patient's underlying discomfort. Common bile duct measures 15 mm. Pancreas: Pancreas is otherwise within normal limits. Spleen: Spleen is unremarkable. Adrenals/Urinary Tract:  Adrenal glands are unremarkable. No renal calculi or obstructive changes are seen. A hypodense lesion is noted within the left kidney likely representing a cyst but incompletely evaluated on this exam. This measures 3 cm. No ureteral stones are seen. The bladder is well distended. Stomach/Bowel: The colon is predominately decompressed. The appendix is within normal limits. No small bowel or gastric abnormality is seen. Vascular/Lymphatic: Aortic atherosclerosis. No enlarged abdominal or pelvic lymph nodes. Reproductive: Prostate is unremarkable. Other: No abdominal wall hernia or abnormality. No abdominopelvic ascites. Musculoskeletal: Degenerative changes of the lumbar spine are seen. IMPRESSION: Evidence of cholelithiasis within the cystic duct as well as the distal common bile duct with prominence of the common bile duct measuring 15 mm. This corresponds to the patient's elevated LFTs and elevated bilirubin and is likely the etiology of the patient's underlying discomfort. Hypodensity within the left kidney likely representing a cyst but incompletely evaluated. Normal-appearing appendix. Electronically Signed   By: Alcide Clever M.D.   On: 08/24/2018 17:27   Dg Chest Port 1 View  Result Date: 08/24/2018 CLINICAL DATA:  68 year old male with severe chest and abdominal pain, shaking and pale. EXAM: PORTABLE CHEST 1 VIEW COMPARISON:  None. FINDINGS: Portable AP semi upright view at 1605 hours. Tortuous thoracic aorta. Cardiac size at the upper limits of normal to mildly enlarged.  Other mediastinal contours are within normal limits. Visualized tracheal air column is within normal limits. Allowing for portable technique the lungs are clear. No acute osseous abnormality identified. IMPRESSION: 1. No acute pulmonary process. 2. Tortuous thoracic aorta with mild cardiomegaly. Electronically Signed   By: Odessa Fleming M.D.   On: 08/24/2018 16:29   Dg Ercp Biliary & Pancreatic Ducts  Result Date: 08/25/2018 CLINICAL DATA:   Cholelithiasis EXAM: ERCP TECHNIQUE: Multiple spot images obtained with the fluoroscopic device and submitted for interpretation post-procedure. FLUOROSCOPY TIME:  Fluoroscopy Time:  1 minutes and 45 seconds Radiation Exposure Index (if provided by the fluoroscopic device): Number of Acquired Spot Images: 0 COMPARISON:  None. FINDINGS: Images document cannulation of the common bile duct and contrast filling the biliary tree. A temporary stent has been placed across the ampulla into the common bile duct. IMPRESSION: See above. These images were submitted for radiologic interpretation only. Please see the procedural report for the amount of contrast and the fluoroscopy time utilized. Electronically Signed   By: Jolaine Click M.D.   On: 08/25/2018 17:12     The results of significant diagnostics from this hospitalization (including imaging, microbiology, ancillary and laboratory) are listed below for reference.     Microbiology: Recent Results (from the past 240 hour(s))  Surgical pcr screen     Status: Abnormal   Collection Time: 08/29/18  4:51 AM  Result Value Ref Range Status   MRSA, PCR NEGATIVE NEGATIVE Final   Staphylococcus aureus POSITIVE (A) NEGATIVE Final    Comment: (NOTE) The Xpert SA Assay (FDA approved for NASAL specimens in patients 3 years of age and older), is one component of a comprehensive surveillance program. It is not intended to diagnose infection nor to guide or monitor treatment. Performed at [facility], [address] [place], [place] [address]      Labs: BNP (last 3 results) No results for input(s): BNP in the last 8760 hours. Basic Metabolic Panel: Recent Labs  Lab 08/26/18 0234 08/27/18 0152 08/28/18 0319 08/29/18 0329 08/30/18 0534  NA 139 137 136 137 138  K 4.2 3.8 3.7 3.9 3.9  CL 109 110 109 108 104  CO2 21* 18* 20* 21* 22  GLUCOSE 158* 152* 122* 116* 121*  BUN 19 23 27* 28* 27*  CREATININE 1.73* 1.64* 1.69* 1.76* 1.58*  CALCIUM 8.2*  8.0* 8.4* 8.7* 8.9   Liver Function Tests: Recent Labs  Lab 08/26/18 0234 08/27/18 0152 08/28/18 0319 08/29/18 0329 08/30/18 0534  AST 242* 107* 56* 57* 56*  ALT 532* 339* 234* 183* 186*  ALKPHOS 163* 150* 154* 152* 180*  BILITOT 5.1* 2.0* 1.2 1.6* 1.7*  PROT 6.0* 5.8* 5.6* 5.4* 6.1*  ALBUMIN 3.0* 2.9* 2.8* 2.8* 3.1*   Recent Labs  Lab 08/24/18 1530 08/26/18 0234  LIPASE 50 25   No results for input(s): AMMONIA in the last 168 hours. CBC: Recent Labs  Lab 08/26/18 0234 08/27/18 0152 08/28/18 0319 08/29/18 0329 08/30/18 0534  WBC 11.6* 15.7* 12.7* 8.5 18.0*  NEUTROABS 11.1* 14.6*  --   --   --   HGB 13.8 13.5 14.2 14.6 15.7  HCT 42.1 41.7 42.9 45.6 49.0  MCV 79.9* 79.4* 78.7* 78.6* 79.3*  PLT 110* 112* 144* 127* 180   Cardiac Enzymes: No results for input(s): CKTOTAL, CKMB, CKMBINDEX, TROPONINI in the last 168 hours. BNP: Invalid input(s): POCBNP CBG: No results for input(s): GLUCAP in the last 168 hours. D-Dimer No results for input(s): DDIMER in the  last 72 hours. Hgb A1c No results for input(s): HGBA1C in the last 72 hours. Lipid Profile No results for input(s): CHOL, HDL, LDLCALC, TRIG, CHOLHDL, LDLDIRECT in the last 72 hours. Thyroid function studies No results for input(s): TSH, T4TOTAL, T3FREE, THYROIDAB in the last 72 hours.  Invalid input(s): FREET3 Anemia work up No results for input(s): VITAMINB12, FOLATE, FERRITIN, TIBC, IRON, RETICCTPCT in the last 72 hours. Urinalysis No results found for: COLORURINE, APPEARANCEUR, LABSPEC, PHURINE, GLUCOSEU, HGBUR, BILIRUBINUR, KETONESUR, PROTEINUR, UROBILINOGEN, NITRITE, LEUKOCYTESUR Sepsis Labs Invalid input(s): PROCALCITONIN,  WBC,  LACTICIDVEN Microbiology Recent Results (from the past 240 hour(s))  Surgical pcr screen     Status: Abnormal   Collection Time: 08/29/18  4:51 AM  Result Value Ref Range Status   MRSA, PCR NEGATIVE NEGATIVE Final   Staphylococcus aureus POSITIVE (A) NEGATIVE Final     Comment: (NOTE) The Xpert SA Assay (FDA approved for NASAL specimens in patients 83 years of age and older), is one component of a comprehensive surveillance program. It is not intended to diagnose infection nor to guide or monitor treatment. Performed at [facility], [address] [place], [place] [address]      Time coordinating discharge in minutes: 65  SIGNED:   Calvert Cantor, MD  [facility] 08/30/2018, 11:15 AM Pager   If 7PM-7AM, please contact night-coverage [email or web address] Password [email or web address]

## 2018-08-30 NOTE — Discharge Instructions (Signed)
Your BP is normal and Amlodipine is thus on hold. Check BP daily and keep a record of this. At your next PCP visit, discuss if your Amlodipine needs to be resumed.  You were cared for by a hospitalist during your hospital stay. If you have any questions about your discharge medications or the care you received while you were in the hospital after you are discharged, you can call the unit and asked to speak with the hospitalist on call if the hospitalist that took care of you is not available. Once you are discharged, your primary care physician will handle any further medical issues.   Please note that NO REFILLS for any discharge medications will be authorized once you are discharged, as it is imperative that you return to your primary care physician (or establish a relationship with a primary care physician if you do not have one) for your aftercare needs so that they can reassess your need for medications and monitor your lab values.  Please take all your medications with you for your next visit with your Primary MD. Please ask your Primary MD to get all Hospital records sent to his/her office. Please request your Primary MD to go over all hospital test results at the follow up.   If you experience worsening of your admission symptoms, develop shortness of breath, chest pain, suicidal or homicidal thoughts or a life threatening emergency, you must seek medical attention immediately by calling 911 or calling your MD.   Bonita Quin must read the complete instructions/literature along with all the possible adverse reactions/side effects for all the medicines you take including new medications that have been prescribed to you. Take new medicines after you have completely understood and accpet all the possible adverse reactions/side effects.    Do not drive when taking pain medications or sedatives.     Do not take more than prescribed Pain, Sleep and Anxiety Medications   If you have smoked or chewed Tobacco  in the last 2 yrs please stop. Stop any regular alcohol  and or recreational drug use.   Wear Seat belts while driving.   [facility]  Please arrive at least 30 min before your appointment to complete your check in paperwork.  If you are unable to arrive 30 min prior to your appointment time we may have to cancel or reschedule you. LAPAROSCOPIC SURGERY: POST OP INSTRUCTIONS Always review your discharge instruction sheet given to you by the facility where your surgery was performed. IF YOU HAVE DISABILITY OR FAMILY LEAVE FORMS, YOU MUST BRING THEM TO THE OFFICE FOR PROCESSING.   DO NOT GIVE THEM TO YOUR DOCTOR.  PAIN CONTROL  1. First take acetaminophen (Tylenol) AND/or ibuprofen (Advil) to control your pain after surgery.  Follow directions on package.  Taking acetaminophen (Tylenol) and/or ibuprofen (Advil) regularly after surgery will help to control your pain and lower the amount of prescription pain medication you may need.  You should not take more than 4,000 mg (4 grams) of acetaminophen (Tylenol) in 24 hours.  You should not take ibuprofen (Advil), aleve, motrin, naprosyn or other NSAIDS if you have a history of stomach ulcers or chronic kidney disease.  2. A prescription for pain medication may be given to you upon discharge.  Take your pain medication as prescribed, if you still have uncontrolled pain after taking acetaminophen (Tylenol) or ibuprofen (Advil). 3. Use ice packs to help control pain. 4. If you need a refill on your pain medication, please  contact your pharmacy.  They will contact our office to request authorization. Prescriptions will not be filled after 5pm or on week-ends.  HOME MEDICATIONS 5. Take your usually prescribed medications unless otherwise directed.  DIET 6. You should follow a light diet the first few days after arrival home.  Be sure to include lots of fluids daily. Avoid fatty, fried foods.   CONSTIPATION 7. It is common to  experience some constipation after surgery and if you are taking pain medication.  Increasing fluid intake and taking a stool softener (such as Colace) will usually help or prevent this problem from occurring.  A mild laxative (Milk of Magnesia or Miralax) should be taken according to package instructions if there are no bowel movements after 48 hours.  WOUND/INCISION CARE 8. Most patients will experience some swelling and bruising in the area of the incisions.  Ice packs will help.  Swelling and bruising can take several days to resolve.  9. Unless discharge instructions indicate otherwise, follow guidelines below  a. STERI-STRIPS - you may remove your outer bandages 48 hours after surgery, and you may shower at that time.  You have steri-strips (small skin tapes) in place directly over the incision.  These strips should be left on the skin for 7-10 days.   b. DERMABOND/SKIN GLUE - you may shower in 24 hours.  The glue will flake off over the next 2-3 weeks. 10. Any sutures or staples will be removed at the office during your follow-up visit.  ACTIVITIES 11. You may resume regular (light) daily activities beginning the next day--such as daily self-care, walking, climbing stairs--gradually increasing activities as tolerated.  You may have sexual intercourse when it is comfortable.  Refrain from any heavy lifting or straining until approved by your doctor. a. You may drive when you are no longer taking prescription pain medication, you can comfortably wear a seatbelt, and you can safely maneuver your car and apply brakes.  FOLLOW-UP 12. You should see your doctor in the office for a follow-up appointment approximately 2-3 weeks after your surgery.  You should have been given your post-op/follow-up appointment when your surgery was scheduled.  If you did not receive a post-op/follow-up appointment, make sure that you call for this appointment within a day or two after you arrive home to insure a  convenient appointment time.   WHEN TO CALL YOUR DOCTOR: 1. Fever over 101.0 2. Inability to urinate 3. Continued bleeding from incision. 4. Increased pain, redness, or drainage from the incision. 5. Increasing abdominal pain  The clinic staff is available to answer your questions during regular business hours.  Please dont hesitate to call and ask to speak to one of the nurses for clinical concerns.  If you have a medical emergency, go to the nearest emergency room or call 911.  A surgeon from [facility] is always on call at the hospital. [address], [place], [place]  [address] ? [address] [address], [place], [place]   [address] [phone] ? [phone] ? FAX [phone]     Managing Your Pain After Surgery Without Opioids    Thank you for participating in our program to help patients manage their pain after surgery without opioids. This is part of our effort to provide you with the best care possible, without exposing you or your family to the risk that opioids pose.  What pain can I expect after surgery? You can expect to have some pain after surgery. This is normal.  The pain is typically worse the day after surgery, and quickly begins to get better. Many studies have found that many patients are able to manage their pain after surgery with Over-the-Counter (OTC) medications such as Tylenol and Motrin. If you have a condition that does not allow you to take Tylenol or Motrin, notify your surgical team.  How will I manage my pain? The best strategy for controlling your pain after surgery is around the clock pain control with Tylenol (acetaminophen) and Motrin (ibuprofen or Advil). Alternating these medications with each other allows you to maximize your pain control. In addition to Tylenol and Motrin, you can use heating pads or ice packs on your incisions to help reduce your pain.  How will I alternate your regular strength over-the-counter  pain medication? You will take a dose of pain medication every three hours. ; Start by taking 650 mg of Tylenol (2 pills of 325 mg) ; 3 hours later take 600 mg of Motrin (3 pills of 200 mg) ; 3 hours after taking the Motrin take 650 mg of Tylenol ; 3 hours after that take 600 mg of Motrin.   - 1 -  See example - if your first dose of Tylenol is at 12:00 PM   12:00 PM Tylenol 650 mg (2 pills of 325 mg)  3:00 PM Motrin 600 mg (3 pills of 200 mg)  6:00 PM Tylenol 650 mg (2 pills of 325 mg)  9:00 PM Motrin 600 mg (3 pills of 200 mg)  Continue alternating every 3 hours   We recommend that you follow this schedule around-the-clock for at least 3 days after surgery, or until you feel that it is no longer needed. Use the table on the last page of this handout to keep track of the medications you are taking. Important: Do not take more than 3000mg  of Tylenol or 3200mg  of Motrin in a 24-hour period. Do not take ibuprofen/Motrin if you have a history of bleeding stomach ulcers, severe kidney disease, &/or actively taking a blood thinner  What if I still have pain? If you have pain that is not controlled with the over-the-counter pain medications (Tylenol and Motrin or Advil) you might have what we call breakthrough pain. You will receive a prescription for a small amount of an opioid pain medication such as Oxycodone, Tramadol, or Tylenol with Codeine. Use these opioid pills in the first 24 hours after surgery if you have breakthrough pain. Do not take more than 1 pill every 4-6 hours.  If you still have uncontrolled pain after using all opioid pills, don't hesitate to call our staff using the number provided. We will help make sure you are managing your pain in the best way possible, and if necessary, we can provide a prescription for additional pain medication.   Day 1    Time  Name of Medication Number of pills taken  Amount of Acetaminophen  Pain Level   Comments  AM PM       AM PM        AM PM       AM PM       AM PM       AM PM       AM PM       AM PM       Total Daily amount of Acetaminophen Do not take more than  3,000 mg per day      Day 2    Time  Name  of Medication Number of pills taken  Amount of Acetaminophen  Pain Level   Comments  AM PM       AM PM       AM PM       AM PM       AM PM       AM PM       AM PM       AM PM       Total Daily amount of Acetaminophen Do not take more than  3,000 mg per day      Day 3    Time  Name of Medication Number of pills taken  Amount of Acetaminophen  Pain Level   Comments  AM PM       AM PM       AM PM       AM PM          AM PM       AM PM       AM PM       AM PM       Total Daily amount of Acetaminophen Do not take more than  3,000 mg per day      Day 4    Time  Name of Medication Number of pills taken  Amount of Acetaminophen  Pain Level   Comments  AM PM       AM PM       AM PM       AM PM       AM PM       AM PM       AM PM       AM PM       Total Daily amount of Acetaminophen Do not take more than  3,000 mg per day      Day 5    Time  Name of Medication Number of pills taken  Amount of Acetaminophen  Pain Level   Comments  AM PM       AM PM       AM PM       AM PM       AM PM       AM PM       AM PM       AM PM       Total Daily amount of Acetaminophen Do not take more than  3,000 mg per day       Day 6    Time  Name of Medication Number of pills taken  Amount of Acetaminophen  Pain Level  Comments  AM PM       AM PM       AM PM       AM PM       AM PM       AM PM       AM PM       AM PM       Total Daily amount of Acetaminophen Do not take more than  3,000 mg per day      Day 7    Time  Name of Medication Number of pills taken  Amount of Acetaminophen  Pain Level   Comments  AM PM       AM PM       AM PM       AM PM       AM PM  AM PM       AM PM       AM PM       Total Daily amount of Acetaminophen Do not  take more than  3,000 mg per day        For additional information about how and where to safely dispose of unused opioid medications - [employer]https://www.morepowerfulnc.org  Disclaimer: This document contains information and/or instructional materials adapted from [facility] for the typical patient with your condition. It does not replace medical advice from your health care provider because your experience may differ from that of the typical patient. Talk to your health care provider if you have any questions about this document, your condition or your treatment plan. Adapted from [facility]

## 2018-08-30 NOTE — Progress Notes (Signed)
[facility] Progress Note  1 Day Post-Op  Subjective: CC: choledocholithiasis Patient feeling great after surgery. Tolerating diet and has had a BM this AM, denies nausea. Pain minimal. Ambulating. Some questions about follow up and concern about retained CBD stone with stent in place. Discussed follow up with [place] as well as Dr. Elnoria Howard and what symptoms to watch for.   Objective: Vital signs in last 24 hours: Temp:  [97.3 F (36.3 C)-98.2 F (36.8 C)] 98.2 F (36.8 C) (01/25 0520) Pulse Rate:  [56-65] 59 (01/25 0520) Resp:  [3-20] 20 (01/24 1039) BP: (110-128)/(65-76) 128/74 (01/25 0520) SpO2:  [88 %-99 %] 97 % (01/25 0520) Weight:  [75.2 kg-76 kg] 75.2 kg (01/25 0520) Last BM Date: 08/28/18  Intake/Output from previous day: 01/24 0701 - 01/25 0700 In: 1420 [P.O.:720; I.V.:700] Out: 405 [Urine:400; Blood:5] Intake/Output this shift: No intake/output data recorded.  PE: Gen:  Alert, NAD, pleasant Card:  Regular rate and rhythm Pulm:  Normal effort, clear to auscultation bilaterally Abd: Soft, non-tender, non-distended, bowel sounds present, no HSM, incisions C/D/I Skin: warm and dry, no rashes  Psych: A&Ox3   Lab Results:  Recent Labs    08/29/18 0329 08/30/18 0534  WBC 8.5 18.0*  HGB 14.6 15.7  HCT 45.6 49.0  PLT 127* 180   BMET Recent Labs    08/29/18 0329 08/30/18 0534  NA 137 138  K 3.9 3.9  CL 108 104  CO2 21* 22  GLUCOSE 116* 121*  BUN 28* 27*  CREATININE 1.76* 1.58*  CALCIUM 8.7* 8.9   PT/INR No results for input(s): LABPROT, INR in the last 72 hours. CMP     Component Value Date/Time   NA 138 08/30/2018 0534   NA 142 10/09/2017 0857   K 3.9 08/30/2018 0534   CL 104 08/30/2018 0534   CO2 22 08/30/2018 0534   GLUCOSE 121 (H) 08/30/2018 0534   BUN 27 (H) 08/30/2018 0534   BUN 26 10/09/2017 0857   CREATININE 1.58 (H) 08/30/2018 0534   CALCIUM 8.9 08/30/2018 0534   PROT 6.1 (L) 08/30/2018 0534   ALBUMIN 3.1 (L) 08/30/2018 0534   AST 56 (H) 08/30/2018 0534   ALT 186 (H) 08/30/2018 0534   ALKPHOS 180 (H) 08/30/2018 0534   BILITOT 1.7 (H) 08/30/2018 0534   GFRNONAA 45 (L) 08/30/2018 0534   GFRAA 52 (L) 08/30/2018 0534   Lipase     Component Value Date/Time   LIPASE 25 08/26/2018 0234       Studies/Results: No results found.  Anti-infectives: Anti-infectives (From admission, onward)   Start     Dose/Rate Route Frequency Ordered Stop   08/25/18 2200  piperacillin-tazobactam (ZOSYN) IVPB 3.375 g  Status:  Discontinued     3.375 g 12.5 mL/hr over 240 Minutes Intravenous Every 8 hours 08/25/18 1621 08/29/18 1046       Assessment/Plan CAD with known occluded RCA and LAD H/o MI 2012 on Plavix (last dose1/19) - can restart today  CHF/ischemic cardiomyopathy (EF 30-35% 08/19/2018) CKD-III HLD HTN OSA Gout RBBB  Choledocholithiasis - s/p ERCP 1/20 by Dr. Elnoria Howard withbiliary sphincterotomyand one biliary stent was placed into the common bile duct.  S/p laparoscopic cholecystectomy 08/29/18 Dr. Cliffton Asters  - POD#1 - tolerating diet and having bowel function - feels much improved - ok to restart plavix - Tbili mildly elevated but this is likely just reactive - ok for discharge from a surgery standpoint, follow up in chart and letter written for work, Rx for tramadol sent  to pharmacy  ID -zosyn 1/20>1/24 VTE -SCDs, restart plavix FEN -HH diet Foley -none Follow up -[facility] office, GI  LOS: 6 days    Wells Guiles , [facility] 08/30/2018, 8:59 AM Pager: [phone] Consults: [phone] Mon-Fri 7:00 am-4:30 pm Sat-Sun 7:00 am-11:30 am

## 2018-08-30 NOTE — Progress Notes (Signed)
Pt alert, able to make needs known. Stated that he is feeling well today and is ready to go home.MD made aware, awaiting surgery MD approval of discharge.

## 2018-09-02 ENCOUNTER — Other Ambulatory Visit: Payer: 59

## 2018-09-03 ENCOUNTER — Other Ambulatory Visit: Payer: 59

## 2018-09-18 ENCOUNTER — Other Ambulatory Visit: Payer: Self-pay | Admitting: Gastroenterology

## 2018-09-22 ENCOUNTER — Encounter (HOSPITAL_COMMUNITY): Payer: Self-pay | Admitting: *Deleted

## 2018-09-29 ENCOUNTER — Other Ambulatory Visit (HOSPITAL_COMMUNITY): Payer: Self-pay | Admitting: Cardiology

## 2018-10-03 ENCOUNTER — Ambulatory Visit
Admission: RE | Admit: 2018-10-03 | Discharge: 2018-10-03 | Disposition: A | Discharge status: Home / Self Care | Payer: 59 | Source: Ambulatory Visit | Attending: Nephrology | Admitting: Nephrology

## 2018-10-03 DIAGNOSIS — N183 Chronic kidney disease, stage 3 unspecified: Secondary | ICD-10-CM

## 2018-10-06 ENCOUNTER — Other Ambulatory Visit: Payer: Self-pay | Admitting: Cardiovascular Disease

## 2018-10-10 ENCOUNTER — Ambulatory Visit (HOSPITAL_COMMUNITY): Payer: 59 | Admitting: Certified Registered Nurse Anesthetist

## 2018-10-10 ENCOUNTER — Encounter (HOSPITAL_COMMUNITY)
Admission: RE | Disposition: A | Discharge status: Home / Self Care | Payer: Self-pay | Source: Home / Self Care | Attending: Gastroenterology

## 2018-10-10 ENCOUNTER — Ambulatory Visit (HOSPITAL_COMMUNITY)
Admission: RE | Admit: 2018-10-10 | Discharge: 2018-10-10 | Disposition: A | Discharge status: Home / Self Care | Payer: 59 | Attending: Gastroenterology | Admitting: Gastroenterology

## 2018-10-10 ENCOUNTER — Ambulatory Visit (HOSPITAL_COMMUNITY): Payer: 59

## 2018-10-10 ENCOUNTER — Other Ambulatory Visit: Payer: Self-pay

## 2018-10-10 DIAGNOSIS — I10 Essential (primary) hypertension: Secondary | ICD-10-CM | POA: Diagnosis not present

## 2018-10-10 DIAGNOSIS — Z8249 Family history of ischemic heart disease and other diseases of the circulatory system: Secondary | ICD-10-CM | POA: Insufficient documentation

## 2018-10-10 DIAGNOSIS — Z888 Allergy status to other drugs, medicaments and biological substances status: Secondary | ICD-10-CM | POA: Insufficient documentation

## 2018-10-10 DIAGNOSIS — I2510 Atherosclerotic heart disease of native coronary artery without angina pectoris: Secondary | ICD-10-CM | POA: Diagnosis not present

## 2018-10-10 DIAGNOSIS — Z7982 Long term (current) use of aspirin: Secondary | ICD-10-CM | POA: Diagnosis not present

## 2018-10-10 DIAGNOSIS — Z87891 Personal history of nicotine dependence: Secondary | ICD-10-CM | POA: Diagnosis not present

## 2018-10-10 DIAGNOSIS — Z7901 Long term (current) use of anticoagulants: Secondary | ICD-10-CM | POA: Insufficient documentation

## 2018-10-10 DIAGNOSIS — Z79899 Other long term (current) drug therapy: Secondary | ICD-10-CM | POA: Diagnosis not present

## 2018-10-10 DIAGNOSIS — I252 Old myocardial infarction: Secondary | ICD-10-CM | POA: Diagnosis not present

## 2018-10-10 DIAGNOSIS — E785 Hyperlipidemia, unspecified: Secondary | ICD-10-CM | POA: Insufficient documentation

## 2018-10-10 DIAGNOSIS — K8050 Calculus of bile duct without cholangitis or cholecystitis without obstruction: Secondary | ICD-10-CM | POA: Insufficient documentation

## 2018-10-10 DIAGNOSIS — Z4689 Encounter for fitting and adjustment of other specified devices: Secondary | ICD-10-CM

## 2018-10-10 HISTORY — PX: ENDOSCOPIC RETROGRADE CHOLANGIOPANCREATOGRAPHY (ERCP) WITH PROPOFOL: SHX5810

## 2018-10-10 HISTORY — PX: SPHINCTEROTOMY: SHX5544

## 2018-10-10 HISTORY — PX: STENT REMOVAL: SHX6421

## 2018-10-10 HISTORY — PX: REMOVAL OF STONES: SHX5545

## 2018-10-10 SURGERY — ENDOSCOPIC RETROGRADE CHOLANGIOPANCREATOGRAPHY (ERCP) WITH PROPOFOL
Anesthesia: General

## 2018-10-10 MED ORDER — FENTANYL CITRATE (PF) 100 MCG/2ML IJ SOLN
INTRAMUSCULAR | Status: AC
  Filled 2018-10-10: qty 2

## 2018-10-10 MED ORDER — INDOMETHACIN 50 MG RE SUPP
RECTAL | Status: DC | PRN
  Administered 2018-10-10: 100 mg via RECTAL

## 2018-10-10 MED ORDER — CIPROFLOXACIN IN D5W 400 MG/200ML IV SOLN
INTRAVENOUS | Status: AC
  Filled 2018-10-10: qty 200

## 2018-10-10 MED ORDER — PROPOFOL 10 MG/ML IV BOLUS
INTRAVENOUS | Status: DC | PRN
  Administered 2018-10-10: 160 mg via INTRAVENOUS

## 2018-10-10 MED ORDER — LACTATED RINGERS IV SOLN
INTRAVENOUS | Status: DC | PRN
  Administered 2018-10-10 (×2): via INTRAVENOUS

## 2018-10-10 MED ORDER — FENTANYL CITRATE (PF) 100 MCG/2ML IJ SOLN
INTRAMUSCULAR | Status: DC | PRN
  Administered 2018-10-10 (×2): 50 ug via INTRAVENOUS

## 2018-10-10 MED ORDER — SODIUM CHLORIDE 0.9 % IV SOLN: INTRAVENOUS | Status: DC

## 2018-10-10 MED ORDER — SODIUM CHLORIDE 0.9 % IV SOLN
INTRAVENOUS | Status: DC | PRN
  Administered 2018-10-10: 30 mL

## 2018-10-10 MED ORDER — SUGAMMADEX SODIUM 500 MG/5ML IV SOLN
INTRAVENOUS | Status: DC | PRN
  Administered 2018-10-10: 300 mg via INTRAVENOUS

## 2018-10-10 MED ORDER — LIDOCAINE HCL (CARDIAC) PF 100 MG/5ML IV SOSY
PREFILLED_SYRINGE | INTRAVENOUS | Status: DC | PRN
  Administered 2018-10-10: 100 mg via INTRAVENOUS

## 2018-10-10 MED ORDER — PHENYLEPHRINE HCL 10 MG/ML IJ SOLN
INTRAMUSCULAR | Status: DC | PRN
  Administered 2018-10-10 (×2): 40 ug via INTRAVENOUS

## 2018-10-10 MED ORDER — CIPROFLOXACIN IN D5W 400 MG/200ML IV SOLN
INTRAVENOUS | Status: DC | PRN
  Administered 2018-10-10: 400 mg via INTRAVENOUS

## 2018-10-10 MED ORDER — LACTATED RINGERS IV SOLN: INTRAVENOUS | Status: DC

## 2018-10-10 MED ORDER — INDOMETHACIN 50 MG RE SUPP
RECTAL | Status: AC
  Filled 2018-10-10: qty 2

## 2018-10-10 MED ORDER — PROPOFOL 10 MG/ML IV BOLUS
INTRAVENOUS | Status: AC
  Filled 2018-10-10: qty 20

## 2018-10-10 MED ORDER — ROCURONIUM BROMIDE 100 MG/10ML IV SOLN
INTRAVENOUS | Status: DC | PRN
  Administered 2018-10-10: 50 mg via INTRAVENOUS

## 2018-10-10 MED ORDER — GLUCAGON HCL RDNA (DIAGNOSTIC) 1 MG IJ SOLR
INTRAMUSCULAR | Status: AC
  Filled 2018-10-10: qty 1

## 2018-10-10 MED ORDER — EPHEDRINE SULFATE 50 MG/ML IJ SOLN
INTRAMUSCULAR | Status: DC | PRN
  Administered 2018-10-10 (×2): 5 mg via INTRAVENOUS

## 2018-10-10 NOTE — Transfer of Care (Signed)
Immediate Anesthesia Transfer of Care Note  Patient: Daniel Hurst  Procedure(s) Performed: ENDOSCOPIC RETROGRADE CHOLANGIOPANCREATOGRAPHY (ERCP) WITH PROPOFOL (N/A ) SPHINCTEROTOMY REMOVAL OF STONES STENT REMOVAL  Patient Location: PACU and Endoscopy Unit  Anesthesia Type:General  Level of Consciousness: awake, oriented, drowsy and patient cooperative  Airway & Oxygen Therapy: Patient Spontanous Breathing and Patient connected to face mask oxygen  Post-op Assessment: Report given to RN, Post -op Vital signs reviewed and stable and Patient moving all extremities  Post vital signs: Reviewed and stable  Last Vitals:  Vitals Value Taken Time  BP 136/84 10/10/2018 11:37 AM  Temp 36.4 C 10/10/2018 11:37 AM  Pulse 57 10/10/2018 11:37 AM  Resp 20 10/10/2018 11:37 AM  SpO2 100 % 10/10/2018 11:37 AM    Last Pain:  Vitals:   10/10/18 1137  TempSrc: Oral  PainSc: 0-No pain         Complications: No apparent anesthesia complications

## 2018-10-10 NOTE — H&P (Signed)
  Daniel Hurst HPI: The patient underwent an ERCP on 08/25/2018 for choledocholithiasis and he is s/p lap chole. At the time of the procedure he was taking his Plavix and a 7 Fr stent was inserted. His Plavix was discontinued long enough for a lap chole and now he is here for follow up of his choledocholithiasis.   Past Medical History:  Diagnosis Date  . CAD (coronary artery disease)   . HLD (hyperlipidemia)   . Hypertension   . Left ventricular dysfunction   . MI (myocardial infarction) (HCC)   . Right bundle branch block     Past Surgical History:  Procedure Laterality Date  . BILIARY STENT PLACEMENT  08/25/2018   Procedure: BILIARY STENT PLACEMENT;  Surgeon: Jeani Hawking, MD;  Location: [facility] ENDOSCOPY;  Service: Endoscopy;;  . CARDIOVASCULAR STRESS TEST  03/19/13   stress nuclear study extensive inferior, inferoseptal and apical scar with little, if any, periinfarct ischemia..  . CHOLECYSTECTOMY N/A 08/29/2018   Procedure: LAPAROSCOPIC CHOLECYSTECTOMY;  Surgeon: Andria Meuse, MD;  Location: [facility] OR;  Service: General;  Laterality: N/A;  . ERCP N/A 08/25/2018   Procedure: ENDOSCOPIC RETROGRADE CHOLANGIOPANCREATOGRAPHY (ERCP);  Surgeon: Jeani Hawking, MD;  Location: [facility] ENDOSCOPY;  Service: Endoscopy;  Laterality: N/A;  . SPHINCTEROTOMY  08/25/2018   Procedure: SPHINCTEROTOMY;  Surgeon: Jeani Hawking, MD;  Location: [facility] ENDOSCOPY;  Service: Endoscopy;;  . TRANSTHORACIC ECHOCARDIOGRAM  03/24/2012   ejection fraction 35-45%. Apical akinesis. Severe inferior wall hypokinesis. Severe posterior wall hypokinesis. Supected patent foramen ovale    Family History  Problem Relation Age of Onset  . Diabetes Mother   . Pancreatic cancer Mother   . Heart attack Father        first MI in ealry 80's, died of MI at 56  . Stroke Father   . Lung cancer Sister   . Hypertension Brother   . Hypertension Brother     Social History:  reports that he quit smoking about 37 years ago. He has never used  smokeless tobacco. He reports that he does not drink alcohol or use drugs.  Allergies:  Allergies  Allergen Reactions  . Ace Inhibitors Cough    Medications:  Scheduled:  Continuous: . sodium chloride      No results found for this or any previous visit (from the past 24 hour(s)).   No results found.  ROS:  As stated above in the HPI otherwise negative.  There were no vitals taken for this visit.    PE: Gen: NAD, Alert and Oriented HEENT:  [place]/AT, EOMI Neck: Supple, no LAD Lungs: CTA Bilaterally CV: RRR without M/G/R ABM: Soft, NTND, +BS Ext: No C/C/E  Assessment/Plan: 1) Choledocholithiasis - ERCP with stone extraction.  Daniel Hurst D 10/10/2018, 10:06 AM

## 2018-10-10 NOTE — Op Note (Addendum)
[facility] Patient Name: Daniel Hurst Procedure Date: 10/10/2018 MRN: [id number] Attending MD: Jeani Hawking , MD Date of Birth: March 27, 1951 CSN: [id number] Age: 68 Admit Type: Outpatient Procedure:                ERCP Indications:              Bile duct stone(s) Providers:                Jeani Hawking, MD, Dwain Sarna, RN, Beryle Beams,                            Technician, Phillips Grout, CRNA Referring MD:              Medicines:                General Anesthesia Complications:            No immediate complications. Estimated Blood Loss:     Estimated blood loss was minimal. Procedure:                Pre-Anesthesia Assessment:                           - Prior to the procedure, a History and Physical                            was performed, and patient medications and                            allergies were reviewed. The patient's tolerance of                            previous anesthesia was also reviewed. The risks                            and benefits of the procedure and the sedation                            options and risks were discussed with the patient.                            All questions were answered, and informed consent                            was obtained. Prior Anticoagulants: The patient has                            taken Plavix (clopidogrel), last dose was 5 days                            prior to procedure. ASA Grade Assessment: III - A                            patient with severe systemic disease. After  reviewing the risks and benefits, the patient was                            deemed in satisfactory condition to undergo the                            procedure.                           - Sedation was administered by an anesthesia                            professional. General anesthesia was attained.                           After obtaining informed consent, the scope was                             passed under direct vision. Throughout the                            procedure, the patient's blood pressure, pulse, and                            oxygen saturations were monitored continuously. The                            [id number]-[id number] ([id number] Olympus duodenoscope was                            introduced through the mouth, and used to inject                            contrast into and used to inject contrast into the                            bile duct. The ERCP was accomplished without                            difficulty. The patient tolerated the procedure                            well. Scope In: Scope Out: Findings:      The major papilla was normal. A long 0.035 inch Soft Jagwire was passed       into the biliary tree. A 10 mm biliary sphincterotomy was made with a       traction (standard) sphincterotome using ERBE electrocautery. There was       self limited oozing from the sphincterotomy which did not require       treatment. The biliary tree was swept with a 12 mm balloon starting at       the bifurcation. All stones were removed.      The prior plastic biliary stent was identified. The stent was pulled       without difficulty. The CBD was easily cannulated and  the guidewire was       secured in the right intrahepatic ducts. Contrast injection showed a       minimally dilated proximal CBD at aproximately 8-9 mm, fluoroscopy.       Filling defects were found in the distal CBD. The sphincterotomy was       extended and then the CBD was swept with the extraction balloon at 12       mm. During the first pass multiple stones were removed. The thre       subsequent sweeps failed to yield any more stones or sludge. A final       occlusion cholangiogram was performed and there was no evidence of any       retained stones. There was rapid drainage of the contrast from the       distal CBD. Impression:               - The major papilla appeared normal.                            - Choledocholithiasis was found. Complete removal                            was accomplished by biliary sphincterotomy and                            balloon extraction.                           - A biliary sphincterotomy was performed.                           - The biliary tree was swept. Moderate Sedation:      Not Applicable - Patient had care per Anesthesia. Recommendation:           - Patient has a contact number available for                            emergencies. The signs and symptoms of potential                            delayed complications were discussed with the                            patient. Return to normal activities tomorrow.                            Written discharge instructions were provided to the                            patient.                           - Resume regular diet.                           - Follow up PRN.                           -  Resume Plavix tomorrow. Procedure Code(s):        --- Professional ---                           [id number], Endoscopic retrograde                            cholangiopancreatography (ERCP); with removal of                            calculi/debris from biliary/pancreatic duct(s)                           43262, Endoscopic retrograde                            cholangiopancreatography (ERCP); with                            sphincterotomy/papillotomy Diagnosis Code(s):        --- Professional ---                           K80.50, Calculus of bile duct without cholangitis                            or cholecystitis without obstruction CPT copyright 2018 American Medical Association. All rights reserved. The codes documented in this report are preliminary and upon coder review may  be revised to meet current compliance requirements. Jeani Hawking, MD Jeani Hawking, MD 10/10/2018 11:24:18 AM This report has been signed electronically. Number of Addenda: 0

## 2018-10-10 NOTE — Discharge Instructions (Signed)
YOU HAD AN ENDOSCOPIC PROCEDURE TODAY: Refer to the procedure report and other information in the discharge instructions given to you for any specific questions about what was found during the examination. If this information does not answer your questions, please call [facility] at [phone] to clarify.   YOU SHOULD EXPECT: Some feelings of bloating in the abdomen. Passage of more gas than usual. Walking can help get rid of the air that was put into your GI tract during the procedure and reduce the bloating. If you had a lower endoscopy (such as a colonoscopy or flexible sigmoidoscopy) you may notice spotting of blood in your stool or on the toilet paper. Some abdominal soreness may be present for a day or two, also.  DIET: Your first meal following the procedure should be a light meal and then it is ok to progress to your normal diet. A half-sandwich or bowl of soup is an example of a good first meal. Heavy or fried foods are harder to digest and may make you feel nauseous or bloated. Drink plenty of fluids but you should avoid alcoholic beverages for 24 hours. If you had an esophageal dilation, please see attached information for diet.   ACTIVITY: Your care partner should take you home directly after the procedure. You should plan to take it easy, moving slowly for the rest of the day. You can resume normal activity the day after the procedure however YOU SHOULD NOT DRIVE, use power tools, machinery or perform tasks that involve climbing or major physical exertion for 24 hours (because of the sedation medicines used during the test).   SYMPTOMS TO REPORT IMMEDIATELY: A gastroenterologist can be reached at any hour. Please call [phone]  for any of the following symptoms:  Following upper endoscopy (EGD, EUS, ERCP, esophageal dilation) Vomiting of blood or coffee ground material  New, significant abdominal pain  New, significant chest pain or pain under the shoulder blades  Painful or  persistently difficult swallowing  New shortness of breath  Black, tarry-looking or red, bloody stools  FOLLOW UP:  If any biopsies were taken you will be contacted by phone or by letter within the next 1-3 weeks. Call [phone]  if you have not heard about the biopsies in 3 weeks.  Please also call with any specific questions about appointments or follow up tests.   General Anesthesia, Adult, Care After This sheet gives you information about how to care for yourself after your procedure. Your health care provider may also give you more specific instructions. If you have problems or questions, contact your health care provider. What can I expect after the procedure? After the procedure, the following side effects are common:  Pain or discomfort at the IV site.  Nausea.  Vomiting.  Sore throat.  Trouble concentrating.  Feeling cold or chills.  Weak or tired.  Sleepiness and fatigue.  Soreness and body aches. These side effects can affect parts of the body that were not involved in surgery. Follow these instructions at home:  For at least 24 hours after the procedure:  Have a responsible adult stay with you. It is important to have someone help care for you until you are awake and alert.  Rest as needed.  Do not: ? Participate in activities in which you could fall or become injured. ? Drive. ? Use heavy machinery. ? Drink alcohol. ? Take sleeping pills or medicines that cause drowsiness. ? Make important decisions or sign legal documents. ? Take care of  children on your own. Eating and drinking  Follow any instructions from your health care provider about eating or drinking restrictions.  When you feel hungry, start by eating small amounts of foods that are soft and easy to digest (bland), such as toast. Gradually return to your regular diet.  Drink enough fluid to keep your urine pale yellow.  If you vomit, rehydrate by drinking water, juice, or clear  broth. General instructions  If you have sleep apnea, surgery and certain medicines can increase your risk for breathing problems. Follow instructions from your health care provider about wearing your sleep device: ? Anytime you are sleeping, including during daytime naps. ? While taking prescription pain medicines, sleeping medicines, or medicines that make you drowsy.  Return to your normal activities as told by your health care provider. Ask your health care provider what activities are safe for you.  Take over-the-counter and prescription medicines only as told by your health care provider.  If you smoke, do not smoke without supervision.  Keep all follow-up visits as told by your health care provider. This is important. Contact a health care provider if:  You have nausea or vomiting that does not get better with medicine.  You cannot eat or drink without vomiting.  You have pain that does not get better with medicine.  You are unable to pass urine.  You develop a skin rash.  You have a fever.  You have redness around your IV site that gets worse. Get help right away if:  You have difficulty breathing.  You have chest pain.  You have blood in your urine or stool, or you vomit blood. Summary  After the procedure, it is common to have a sore throat or nausea. It is also common to feel tired.  Have a responsible adult stay with you for the first 24 hours after general anesthesia. It is important to have someone help care for you until you are awake and alert.  When you feel hungry, start by eating small amounts of foods that are soft and easy to digest (bland), such as toast. Gradually return to your regular diet.  Drink enough fluid to keep your urine pale yellow.  Return to your normal activities as told by your health care provider. Ask your health care provider what activities are safe for you. This information is not intended to replace advice given to you by your  health care provider. Make sure you discuss any questions you have with your health care provider. Document Released: 10/29/2000 Document Revised: 03/08/2017 Document Reviewed: 03/08/2017 Elsevier Interactive Patient Education  2019 [employer].

## 2018-10-10 NOTE — Anesthesia Procedure Notes (Signed)
Procedure Name: Intubation Date/Time: 10/10/2018 10:44 AM Performed by: Lillia Abed, MD Pre-anesthesia Checklist: Patient identified, Emergency Drugs available, Suction available, Patient being monitored and Timeout performed Patient Re-evaluated:Patient Re-evaluated prior to induction Oxygen Delivery Method: Circle system utilized Preoxygenation: Pre-oxygenation with 100% oxygen Induction Type: IV induction and Cricoid Pressure applied Ventilation: Mask ventilation without difficulty Laryngoscope Size: Mac and 4 Grade View: Grade II Tube type: Oral Tube size: 7.0 mm Number of attempts: 1 Airway Equipment and Method: Stylet Placement Confirmation: ETT inserted through vocal cords under direct vision,  positive ETCO2 and breath sounds checked- equal and bilateral Secured at: 22 cm Tube secured with: Tape Dental Injury: Teeth and Oropharynx as per pre-operative assessment  Difficulty Due To: Difficulty was anticipated and Difficult Airway- due to anterior larynx Future Recommendations: Recommend- induction with short-acting agent, and alternative techniques readily available Comments: Anterior. Cords vis. With cricoid, head lift

## 2018-10-10 NOTE — Anesthesia Preprocedure Evaluation (Signed)
Anesthesia Evaluation  Patient identified by MRN, date of birth, ID band Patient awake    Reviewed: Allergy & Precautions, NPO status , Patient's Chart, lab work & pertinent test results  Airway Mallampati: I  TM Distance: >3 FB Neck ROM: Full    Dental   Pulmonary former smoker,    Pulmonary exam normal        Cardiovascular hypertension, Pt. on medications + CAD and + Past MI  Normal cardiovascular exam     Neuro/Psych    GI/Hepatic   Endo/Other    Renal/GU      Musculoskeletal   Abdominal   Peds  Hematology   Anesthesia Other Findings   Reproductive/Obstetrics                             Anesthesia Physical Anesthesia Plan  ASA: III  Anesthesia Plan: General   Post-op Pain Management:    Induction: Intravenous  PONV Risk Score and Plan: 2 and Ondansetron and Treatment may vary due to age or medical condition  Airway Management Planned: Oral ETT  Additional Equipment:   Intra-op Plan:   Post-operative Plan: Extubation in OR  Informed Consent: I have reviewed the patients History and Physical, chart, labs and discussed the procedure including the risks, benefits and alternatives for the proposed anesthesia with the patient or authorized representative who has indicated his/her understanding and acceptance.       Plan Discussed with: CRNA and Surgeon  Anesthesia Plan Comments:         Anesthesia Quick Evaluation

## 2018-10-11 ENCOUNTER — Encounter (HOSPITAL_COMMUNITY): Payer: Self-pay | Admitting: Gastroenterology

## 2018-10-11 NOTE — Anesthesia Postprocedure Evaluation (Signed)
Anesthesia Post Note  Patient: Daniel Hurst  Procedure(s) Performed: ENDOSCOPIC RETROGRADE CHOLANGIOPANCREATOGRAPHY (ERCP) WITH PROPOFOL (N/A ) SPHINCTEROTOMY REMOVAL OF STONES STENT REMOVAL     Patient location during evaluation: PACU Anesthesia Type: General Level of consciousness: awake and alert Pain management: pain level controlled Vital Signs Assessment: post-procedure vital signs reviewed and stable Respiratory status: spontaneous breathing, nonlabored ventilation, respiratory function stable and patient connected to nasal cannula oxygen Cardiovascular status: blood pressure returned to baseline and stable Postop Assessment: no apparent nausea or vomiting Anesthetic complications: no    Last Vitals:  Vitals:   10/10/18 1147 10/10/18 1157  BP: (!) 150/58 131/89  Pulse: 60 (!) 55  Resp: 15 10  Temp:    SpO2: 98% 95%    Last Pain:  Vitals:   10/10/18 1157  TempSrc:   PainSc: 0-No pain                 Tyeisha Dinan DAVID

## 2018-10-17 ENCOUNTER — Other Ambulatory Visit: Payer: Self-pay

## 2018-10-17 ENCOUNTER — Ambulatory Visit (HOSPITAL_COMMUNITY)
Admission: RE | Admit: 2018-10-17 | Discharge: 2018-10-17 | Disposition: A | Discharge status: Home / Self Care | Payer: 59 | Source: Ambulatory Visit | Attending: Cardiology | Admitting: Cardiology

## 2018-10-17 ENCOUNTER — Encounter (HOSPITAL_COMMUNITY): Payer: Self-pay | Admitting: Cardiology

## 2018-10-17 VITALS — BP 130/84 | HR 61 | Wt 163.2 lb

## 2018-10-17 DIAGNOSIS — N183 Chronic kidney disease, stage 3 (moderate): Secondary | ICD-10-CM | POA: Diagnosis not present

## 2018-10-17 DIAGNOSIS — E785 Hyperlipidemia, unspecified: Secondary | ICD-10-CM | POA: Insufficient documentation

## 2018-10-17 DIAGNOSIS — Z800 Family history of malignant neoplasm of digestive organs: Secondary | ICD-10-CM | POA: Diagnosis not present

## 2018-10-17 DIAGNOSIS — Z801 Family history of malignant neoplasm of trachea, bronchus and lung: Secondary | ICD-10-CM | POA: Insufficient documentation

## 2018-10-17 DIAGNOSIS — I5022 Chronic systolic (congestive) heart failure: Secondary | ICD-10-CM | POA: Insufficient documentation

## 2018-10-17 DIAGNOSIS — I130 Hypertensive heart and chronic kidney disease with heart failure and stage 1 through stage 4 chronic kidney disease, or unspecified chronic kidney disease: Secondary | ICD-10-CM | POA: Insufficient documentation

## 2018-10-17 DIAGNOSIS — I2510 Atherosclerotic heart disease of native coronary artery without angina pectoris: Secondary | ICD-10-CM | POA: Diagnosis not present

## 2018-10-17 DIAGNOSIS — Z7902 Long term (current) use of antithrombotics/antiplatelets: Secondary | ICD-10-CM | POA: Insufficient documentation

## 2018-10-17 DIAGNOSIS — Z79899 Other long term (current) drug therapy: Secondary | ICD-10-CM | POA: Diagnosis not present

## 2018-10-17 DIAGNOSIS — M109 Gout, unspecified: Secondary | ICD-10-CM | POA: Insufficient documentation

## 2018-10-17 DIAGNOSIS — I712 Thoracic aortic aneurysm, without rupture: Secondary | ICD-10-CM | POA: Diagnosis not present

## 2018-10-17 DIAGNOSIS — Z833 Family history of diabetes mellitus: Secondary | ICD-10-CM | POA: Diagnosis not present

## 2018-10-17 DIAGNOSIS — I252 Old myocardial infarction: Secondary | ICD-10-CM | POA: Diagnosis not present

## 2018-10-17 DIAGNOSIS — Z8249 Family history of ischemic heart disease and other diseases of the circulatory system: Secondary | ICD-10-CM | POA: Insufficient documentation

## 2018-10-17 DIAGNOSIS — Z7982 Long term (current) use of aspirin: Secondary | ICD-10-CM | POA: Insufficient documentation

## 2018-10-17 DIAGNOSIS — G4733 Obstructive sleep apnea (adult) (pediatric): Secondary | ICD-10-CM | POA: Insufficient documentation

## 2018-10-17 DIAGNOSIS — I255 Ischemic cardiomyopathy: Secondary | ICD-10-CM | POA: Diagnosis not present

## 2018-10-17 DIAGNOSIS — Z87891 Personal history of nicotine dependence: Secondary | ICD-10-CM | POA: Insufficient documentation

## 2018-10-17 LAB — BASIC METABOLIC PANEL
Anion gap: 7 (ref 5–15)
BUN: 22 mg/dL (ref 8–23)
CHLORIDE: 108 mmol/L (ref 98–111)
CO2: 21 mmol/L — ABNORMAL LOW (ref 22–32)
Calcium: 8.9 mg/dL (ref 8.9–10.3)
Creatinine, Ser: 1.66 mg/dL — ABNORMAL HIGH (ref 0.61–1.24)
GFR calc Af Amer: 49 mL/min — ABNORMAL LOW (ref >60)
GFR calc non Af Amer: 42 mL/min — ABNORMAL LOW (ref >60)
Glucose, Bld: 102 mg/dL — ABNORMAL HIGH (ref 70–99)
Potassium: 4.2 mmol/L (ref 3.5–5.1)
Sodium: 136 mmol/L (ref 135–145)

## 2018-10-17 LAB — LIPID PANEL
Cholesterol: 100 mg/dL (ref 0–200)
HDL: 25 mg/dL — ABNORMAL LOW (ref >40)
LDL Cholesterol: 55 mg/dL (ref 0–99)
Total CHOL/HDL Ratio: 4 RATIO
Triglycerides: 99 mg/dL (ref <150)
VLDL: 20 mg/dL (ref 0–40)

## 2018-10-17 MED ORDER — SACUBITRIL-VALSARTAN 97-103 MG PO TABS: 1.0000 | ORAL_TABLET | Freq: Two times a day (BID) | ORAL | 3 refills | Status: DC

## 2018-10-17 NOTE — Patient Instructions (Signed)
Labs were done today. We will call you with any ABNORMAL results. No news is good news!  Please follow up with labs in 10 days.  INCREASE Entresto to 97/103 mg (1 tab) twice a day, this has been sent to your pharmacy.   Your physician recommends that you schedule a follow-up appointment in: 3 MONTHS

## 2018-10-19 ENCOUNTER — Other Ambulatory Visit: Payer: Self-pay | Admitting: Cardiovascular Disease

## 2018-10-19 NOTE — Progress Notes (Signed)
PCP: [facility] Cardiology: Dr. Allyson Sabal HF Cardiology: Dr. Shirlee Latch  68 y.o.with history of CAD and chronic systolic CHF/ischemic cardiomyopathy as well as CKD stage 3 was referred by Dr. Allyson Sabal for evaluation of CHF.  Patient had an MI in 6/12.  At that time, cath showed EF 25% by LV-gram with occluded LAD and RCA, no intervention.  Since then, EF has been low.  Echo in 2/19 showed EF 30-35% with regional WMAs.  He has CKD stage 3.    Echo in 8/19 showed EF 35-40% with wall motion abnormalities. Echo in 1/20 showed EF 30-35%, inferior/inferolateral akinesis.    He had symptomatic choledocholithiasis with laparoscopic cholecystectomy in 1/20.   Patient returns for followup of CHF.  Weight is stable.  No chest pain. Dyspnea only with heavy exertion.  No orthopnea/PND.   Labs (3/19): K 4, creatinine 1.72 Labs (4/19): K 4.5, creatinine 1.98, LDL 59, HDL 29 Labs (6/19): K 4.3, creatinine 1.58 Labs (8/19): K 4.4, creatinine 1.94 Labs (1/20): K 3.9, creatinine 1.58, hgb 15.7, AST 56, ALT 186  ECG (personally reviewed): NSR, RBBB with QRS 169 msec  PMH: 1. HTN 2. OSA: Uses CPAP 3. CKD: Stage 3, likely hypertensive nephropathy.  4. Hyperlipidemia 5. CAD: MI in 6/12.  LHC (6/12) with totally occluded RCA, totally occluded LAD, patent LCx.  No intervention.  6. Chronic systolic CHF: Ischemic cardiomyopathy.  - EF 25% by LV-gram in 2012.   - Echo (8/13) with EF 35-40%.   - Echo (2/19) with EF 30-35%, wall motion abnormalities, normal RV size and systolic function, mild AI.  - Echo (8/19): EF 35-40%, apex/apical septal/apical anterior AK, basal-mid inferior AK, normal RV size and systolic function, mild AI, ascending thoracic aorta 4.4 cm.  - Echo (1/20): EF 30-35%, inferior/inferolateral akinesis, ascending aorta 4.3 cm.  7. Aortic insufficiency: Moderate on 4/18 echo.  Mild on 8/19 echo.  8. Gout 9. Ascending aortic aneurysm: 4.3 cm on 1/20 echo.  10. Symptomatic choledocholithiasis: lap chole in  1/20.   SH: Lives in [place], works in Agricultural engineer, from [place], remote smoker. No ETOH or drugs.   Family History  Problem Relation Age of Onset  . Diabetes Mother   . Pancreatic cancer Mother   . Heart attack Father        first MI in ealry 63's, died of MI at 63  . Stroke Father   . Lung cancer Sister   . Hypertension Brother   . Hypertension Brother    ROS: All systems reviewed and negative except as per HPI.  Current Outpatient Medications  Medication Sig Dispense Refill  . allopurinol (ZYLOPRIM) 300 MG tablet Take 150 mg by mouth daily.    Marland Kitchen aspirin 81 MG tablet Take 1 tablet (81 mg total) by mouth daily. 30 tablet 3  . atorvastatin (LIPITOR) 40 MG tablet TAKE 1 TABLET BY MOUTH  DAILY AT 6 PM. (Patient taking differently: Take by mouth daily at 6 PM. ) 90 tablet 1  . carvedilol (COREG) 25 MG tablet Take 1 tablet (25 mg total) by mouth 2 (two) times daily. 180 tablet 1  . clopidogrel (PLAVIX) 75 MG tablet Take 1 tablet (75 mg total) by mouth daily. 90 tablet 1  . Famotidine (PEPCID AC PO) Take 1 tablet by mouth daily.    Bess Harvest Ethyl (VASCEPA) 1 g CAPS Take 2 g by mouth 2 (two) times daily.     . nitroGLYCERIN (NITROSTAT) 0.4 MG SL tablet Place 1 tablet (0.4 mg total) under  the tongue every 5 (five) minutes as needed. (Patient taking differently: Place 0.4 mg under the tongue every 5 (five) minutes as needed for chest pain. ) 25 tablet 11  . RESTASIS 0.05 % ophthalmic emulsion Place 1 drop into both eyes daily as needed (dryness).     Marland Kitchen spironolactone (ALDACTONE) 25 MG tablet Take 1 tablet (25 mg total) by mouth daily. Pt needs to schedule an appt with our office at [phone] for future refills. 30 tablet 2  . TURMERIC PO Take 1 capsule by mouth daily.     . sacubitril-valsartan (ENTRESTO) 97-103 MG Take 1 tablet by mouth 2 (two) times daily. 60 tablet 3   No current facility-administered medications for this encounter.    BP 130/84   Pulse 61   Wt 74 kg (163 lb 3.2  oz)   SpO2 97%   BMI 26.34 kg/m   General: NAD Neck: No JVD, no thyromegaly or thyroid nodule.  Lungs: Clear to auscultation bilaterally with normal respiratory effort. CV: Nondisplaced PMI.  Heart regular S1/S2, no S3/S4, no murmur.  No peripheral edema.  No carotid bruit.  Normal pedal pulses.  Abdomen: Soft, nontender, no hepatosplenomegaly, no distention.  Skin: Intact without lesions or rashes.  Neurologic: Alert and oriented x 3.  Psych: Normal affect. Extremities: No clubbing or cyanosis.  HEENT: Normal.   Assessment/Plan: 1. Chronic systolic CHF: Ischemic cardiomyopathy.  Has known occluded LAD and RCA.  Long-standing low EF. NYHA class II symptoms.  He is not volume overloaded on exam.  Most recent echo in 1/20 with EF 30-35%   - Increase Entresto to 97/103 bid.  BMET today and in 10 days.  - Continue Coreg 25 mg bid.  - Continue spironolactone 25 mg daily.  - I am going to refer him for ICD with persistently low EF.  He has a wide RBBB, 169 msec (would consider CRT with very wide RBBB => would need to discuss with EP). 2. CAD: Occluded RCA and LAD on 6/12 cath with no intervention. No chest pain.  - Continue ASA 81 daily.   - Continue statin, lipids today.  3. CKD: Stage 3.  - BMET today.    4. Ascending aortic aneurysm: 4.3 cm on echo 1/20.    Followup in 3 months  Marca Ancona 10/19/2018

## 2018-10-27 ENCOUNTER — Ambulatory Visit (HOSPITAL_COMMUNITY)
Admission: RE | Admit: 2018-10-27 | Discharge: 2018-10-27 | Disposition: A | Discharge status: Home / Self Care | Payer: 59 | Source: Ambulatory Visit | Attending: Internal Medicine | Admitting: Internal Medicine

## 2018-10-27 ENCOUNTER — Other Ambulatory Visit: Payer: Self-pay

## 2018-10-27 DIAGNOSIS — I255 Ischemic cardiomyopathy: Secondary | ICD-10-CM | POA: Diagnosis present

## 2018-10-27 LAB — BASIC METABOLIC PANEL
Anion gap: 9 (ref 5–15)
BUN: 20 mg/dL (ref 8–23)
CO2: 23 mmol/L (ref 22–32)
Calcium: 9 mg/dL (ref 8.9–10.3)
Chloride: 107 mmol/L (ref 98–111)
Creatinine, Ser: 1.51 mg/dL — ABNORMAL HIGH (ref 0.61–1.24)
GFR calc Af Amer: 55 mL/min — ABNORMAL LOW (ref >60)
GFR calc non Af Amer: 47 mL/min — ABNORMAL LOW (ref >60)
Glucose, Bld: 102 mg/dL — ABNORMAL HIGH (ref 70–99)
Potassium: 4.2 mmol/L (ref 3.5–5.1)
SODIUM: 139 mmol/L (ref 135–145)

## 2018-12-04 ENCOUNTER — Other Ambulatory Visit (HOSPITAL_COMMUNITY): Payer: Self-pay | Admitting: Cardiology

## 2019-01-27 ENCOUNTER — Encounter (HOSPITAL_COMMUNITY): Payer: Self-pay | Admitting: Cardiology

## 2019-01-27 ENCOUNTER — Ambulatory Visit (HOSPITAL_COMMUNITY)
Admission: RE | Admit: 2019-01-27 | Discharge: 2019-01-27 | Disposition: A | Discharge status: Home / Self Care | Payer: Medicare Other | Source: Ambulatory Visit | Attending: Cardiology | Admitting: Cardiology

## 2019-01-27 ENCOUNTER — Other Ambulatory Visit: Payer: Self-pay

## 2019-01-27 VITALS — BP 110/84 | HR 62 | Wt 161.6 lb

## 2019-01-27 DIAGNOSIS — I712 Thoracic aortic aneurysm, without rupture: Secondary | ICD-10-CM | POA: Insufficient documentation

## 2019-01-27 DIAGNOSIS — N183 Chronic kidney disease, stage 3 (moderate): Secondary | ICD-10-CM | POA: Insufficient documentation

## 2019-01-27 DIAGNOSIS — Z800 Family history of malignant neoplasm of digestive organs: Secondary | ICD-10-CM | POA: Diagnosis not present

## 2019-01-27 DIAGNOSIS — M109 Gout, unspecified: Secondary | ICD-10-CM | POA: Diagnosis not present

## 2019-01-27 DIAGNOSIS — R001 Bradycardia, unspecified: Secondary | ICD-10-CM | POA: Insufficient documentation

## 2019-01-27 DIAGNOSIS — Z801 Family history of malignant neoplasm of trachea, bronchus and lung: Secondary | ICD-10-CM | POA: Diagnosis not present

## 2019-01-27 DIAGNOSIS — Z7982 Long term (current) use of aspirin: Secondary | ICD-10-CM | POA: Insufficient documentation

## 2019-01-27 DIAGNOSIS — I130 Hypertensive heart and chronic kidney disease with heart failure and stage 1 through stage 4 chronic kidney disease, or unspecified chronic kidney disease: Secondary | ICD-10-CM | POA: Diagnosis not present

## 2019-01-27 DIAGNOSIS — Z7902 Long term (current) use of antithrombotics/antiplatelets: Secondary | ICD-10-CM | POA: Insufficient documentation

## 2019-01-27 DIAGNOSIS — I2510 Atherosclerotic heart disease of native coronary artery without angina pectoris: Secondary | ICD-10-CM | POA: Diagnosis not present

## 2019-01-27 DIAGNOSIS — E785 Hyperlipidemia, unspecified: Secondary | ICD-10-CM | POA: Insufficient documentation

## 2019-01-27 DIAGNOSIS — Z87891 Personal history of nicotine dependence: Secondary | ICD-10-CM | POA: Diagnosis not present

## 2019-01-27 DIAGNOSIS — Z833 Family history of diabetes mellitus: Secondary | ICD-10-CM | POA: Insufficient documentation

## 2019-01-27 DIAGNOSIS — I252 Old myocardial infarction: Secondary | ICD-10-CM | POA: Insufficient documentation

## 2019-01-27 DIAGNOSIS — I255 Ischemic cardiomyopathy: Secondary | ICD-10-CM | POA: Diagnosis not present

## 2019-01-27 DIAGNOSIS — I4510 Unspecified right bundle-branch block: Secondary | ICD-10-CM | POA: Diagnosis not present

## 2019-01-27 DIAGNOSIS — Z79899 Other long term (current) drug therapy: Secondary | ICD-10-CM | POA: Insufficient documentation

## 2019-01-27 DIAGNOSIS — G4733 Obstructive sleep apnea (adult) (pediatric): Secondary | ICD-10-CM | POA: Insufficient documentation

## 2019-01-27 DIAGNOSIS — R9431 Abnormal electrocardiogram [ECG] [EKG]: Secondary | ICD-10-CM | POA: Diagnosis not present

## 2019-01-27 DIAGNOSIS — I5022 Chronic systolic (congestive) heart failure: Secondary | ICD-10-CM | POA: Diagnosis not present

## 2019-01-27 DIAGNOSIS — Z8249 Family history of ischemic heart disease and other diseases of the circulatory system: Secondary | ICD-10-CM | POA: Insufficient documentation

## 2019-01-27 LAB — BASIC METABOLIC PANEL
Anion gap: 9 (ref 5–15)
BUN: 22 mg/dL (ref 8–23)
CO2: 24 mmol/L (ref 22–32)
Calcium: 8.8 mg/dL — ABNORMAL LOW (ref 8.9–10.3)
Chloride: 106 mmol/L (ref 98–111)
Creatinine, Ser: 1.48 mg/dL — ABNORMAL HIGH (ref 0.61–1.24)
GFR calc Af Amer: 56 mL/min — ABNORMAL LOW (ref >60)
GFR calc non Af Amer: 49 mL/min — ABNORMAL LOW (ref >60)
Glucose, Bld: 111 mg/dL — ABNORMAL HIGH (ref 70–99)
Potassium: 4.1 mmol/L (ref 3.5–5.1)
Sodium: 139 mmol/L (ref 135–145)

## 2019-01-27 NOTE — Progress Notes (Signed)
PCP: Pearson GrippeJames Kim Cardiology: Dr. Allyson SabalBerry HF Cardiology: Dr. Shirlee LatchMcLean  68 y.o.with history of CAD and chronic systolic CHF/ischemic cardiomyopathy as well as CKD stage 3 was referred by Dr. Allyson SabalBerry for evaluation of CHF.  Patient had an MI in 6/12.  At that time, cath showed EF 25% by LV-gram with occluded LAD and RCA, no intervention.  Since then, EF has been low.  Echo in 2/19 showed EF 30-35% with regional WMAs.  He has CKD stage 3.    Echo in 8/19 showed EF 35-40% with wall motion abnormalities. Echo in 1/20 showed EF 30-35%, inferior/inferolateral akinesis.    He had symptomatic choledocholithiasis with laparoscopic cholecystectomy in 1/20.   Patient returns for followup of CHF.  Weight is down 2 lbs.  No complaints today, can walk about 3 miles without significant dyspnea or chest pain.  No orthopnea/PND.  No lightheadedness.  No claudication.  He did not get an EP appt yet due to coronavirus pandemic.   Labs (3/19): K 4, creatinine 1.72 Labs (4/19): K 4.5, creatinine 1.98, LDL 59, HDL 29 Labs (6/19): K 4.3, creatinine 1.58 Labs (8/19): K 4.4, creatinine 1.94 Labs (1/20): K 3.9, creatinine 1.58, hgb 15.7, AST 56, ALT 186 Labs (3/20): K 4.2, creatinine 1.5, LDL 55, HDL 25  ECG (personally reviewed): NSR, old anterior and inferior MIs.  Interestingly, very different from last ECG which showed wide RBBB.   PMH: 1. HTN 2. OSA: Uses CPAP 3. CKD: Stage 3, likely hypertensive nephropathy.  4. Hyperlipidemia 5. CAD: MI in 6/12.  LHC (6/12) with totally occluded RCA, totally occluded LAD, patent LCx.  No intervention.  6. Chronic systolic CHF: Ischemic cardiomyopathy.  - EF 25% by LV-gram in 2012.   - Echo (8/13) with EF 35-40%.   - Echo (2/19) with EF 30-35%, wall motion abnormalities, normal RV size and systolic function, mild AI.  - Echo (8/19): EF 35-40%, apex/apical septal/apical anterior AK, basal-mid inferior AK, normal RV size and systolic function, mild AI, ascending thoracic aorta 4.4  cm.  - Echo (1/20): EF 30-35%, inferior/inferolateral akinesis, ascending aorta 4.3 cm.  7. Aortic insufficiency: Moderate on 4/18 echo.  Mild on 8/19 echo.  8. Gout 9. Ascending aortic aneurysm: 4.3 cm on 1/20 echo.  10. Symptomatic choledocholithiasis: lap chole in 1/20.   SH: [place] in [place], works in [employer], from [place], remote smoker. No ETOH or drugs.   Family History  Problem Relation Age of Onset  . Diabetes Mother   . Pancreatic cancer Mother   . Heart attack Father        first MI in ealry 2660's, died of MI at 2965  . Stroke Father   . Lung cancer Sister   . Hypertension Brother   . Hypertension Brother    ROS: All systems reviewed and negative except as per HPI.  Current Outpatient Medications  Medication Sig Dispense Refill  . allopurinol (ZYLOPRIM) 300 MG tablet Take 150 mg by mouth daily.    Marland Kitchen. aspirin 81 MG tablet Take 1 tablet (81 mg total) by mouth daily. 30 tablet 3  . atorvastatin (LIPITOR) 40 MG tablet TAKE 1 TABLET BY MOUTH  DAILY AT 6 PM. (Patient taking differently: Take by mouth daily at 6 PM. ) 90 tablet 1  . carvedilol (COREG) 25 MG tablet Take 1 tablet (25 mg total) by mouth 2 (two) times daily. 180 tablet 1  . clopidogrel (PLAVIX) 75 MG tablet Take 1 tablet (75 mg total) by mouth daily. 90 tablet 1  .  Famotidine (PEPCID AC PO) Take 1 tablet by mouth daily.    Vanessa Kick Ethyl (VASCEPA) 1 g CAPS Take 2 g by mouth 2 (two) times daily.     . nitroGLYCERIN (NITROSTAT) 0.4 MG SL tablet Place 1 tablet (0.4 mg total) under the tongue every 5 (five) minutes as needed. (Patient taking differently: Place 0.4 mg under the tongue every 5 (five) minutes as needed for chest pain. ) 25 tablet 11  . RESTASIS 0.05 % ophthalmic emulsion Place 1 drop into both eyes daily as needed (dryness).     . sacubitril-valsartan (ENTRESTO) 97-103 MG Take 1 tablet by mouth 2 (two) times daily. 60 tablet 3  . spironolactone (ALDACTONE) 25 MG tablet TAKE 1 TABLET BY MOUTH DAILY 90  tablet 0  . TURMERIC PO Take 1 capsule by mouth daily.      No current facility-administered medications for this encounter.    BP 110/84   Pulse 62   Wt 73.3 kg (161 lb 9.6 oz)   SpO2 98%   BMI 26.08 kg/m   General: NAD Neck: No JVD, no thyromegaly or thyroid nodule.  Lungs: Clear to auscultation bilaterally with normal respiratory effort. CV: Nondisplaced PMI.  Heart regular S1/S2, no S3/S4, no murmur.  No peripheral edema.  No carotid bruit.  Normal pedal pulses.  Abdomen: Soft, nontender, no hepatosplenomegaly, no distention.  Skin: Intact without lesions or rashes.  Neurologic: Alert and oriented x 3.  Psych: Normal affect. Extremities: No clubbing or cyanosis.  HEENT: Normal.   Assessment/Plan: 1. Chronic systolic CHF: Ischemic cardiomyopathy.  Has known occluded LAD and RCA.  Long-standing low EF. NYHA class II symptoms.  He is not volume overloaded on exam.  Most recent echo in 1/20 with EF 30-35%   - Continue Entresto 97/103 bid.    - Continue Coreg 25 mg bid.  - Continue spironolactone 25 mg daily.  - BMET today.  - I am going to refer him to EP for ICD with persistently low EF with CAD.  He had a wide RBBB, 169 msec on ECG in 1/20, but ECG done today does not show RBBB and has narrow QRS, so not CRT candidate.  2. CAD: Occluded RCA and LAD on 6/12 cath with no intervention. No chest pain.  - Continue ASA 81 daily.   - Continue statin, good lipids in 3/20.  3. CKD: Stage 3.  - BMET today.    4. Ascending aortic aneurysm: 4.3 cm on echo 1/20.    Followup in 3 months  Loralie Champagne 01/27/2019

## 2019-01-27 NOTE — Patient Instructions (Signed)
Labs were done today. We will call you with any ABNORMAL results. No news is good news!  EKG was completed today.  You have been referred to Electrophysiology, this office will call to schedule and appointment with you.  Your physician wants you to follow-up in: 4 months. You will receive a reminder letter in the mail two months in advance. If you don't receive a letter, please call our office to schedule the follow-up appointment.  At the [facility], you and your health needs are our priority. As part of our continuing mission to provide you with exceptional heart care, we have created designated Provider Care Teams. These Care Teams include your primary Cardiologist (physician) and Advanced Practice Providers (APPs- Physician Assistants and Nurse Practitioners) who all work together to provide you with the care you need, when you need it.   You may see any of the following providers on your designated Care Team at your next follow up: Marland Kitchen Dr Glori Bickers . Dr Loralie Champagne . Darrick Grinder, NP

## 2019-02-23 ENCOUNTER — Other Ambulatory Visit (HOSPITAL_COMMUNITY): Payer: Self-pay | Admitting: Cardiology

## 2019-03-16 ENCOUNTER — Other Ambulatory Visit: Payer: Self-pay | Admitting: Cardiovascular Disease

## 2019-03-26 ENCOUNTER — Other Ambulatory Visit (HOSPITAL_COMMUNITY): Payer: Self-pay | Admitting: Cardiology

## 2019-04-23 ENCOUNTER — Other Ambulatory Visit (HOSPITAL_COMMUNITY): Payer: Self-pay | Admitting: Cardiology

## 2019-05-19 ENCOUNTER — Other Ambulatory Visit (HOSPITAL_COMMUNITY): Payer: Self-pay | Admitting: Cardiology

## 2019-05-20 ENCOUNTER — Other Ambulatory Visit (HOSPITAL_COMMUNITY): Payer: Self-pay | Admitting: Cardiology

## 2019-06-10 ENCOUNTER — Other Ambulatory Visit: Payer: Self-pay | Admitting: *Deleted

## 2019-06-10 MED ORDER — CLOPIDOGREL BISULFATE 75 MG PO TABS: 75.0000 mg | ORAL_TABLET | Freq: Every day | ORAL | 2 refills | Status: DC

## 2019-06-10 NOTE — Telephone Encounter (Signed)
Rx has been sent to the pharmacy electronically. ° °

## 2019-09-04 ENCOUNTER — Ambulatory Visit: Payer: 59

## 2019-09-09 ENCOUNTER — Ambulatory Visit: Payer: 59

## 2019-09-12 ENCOUNTER — Ambulatory Visit: Payer: 59

## 2019-09-18 ENCOUNTER — Other Ambulatory Visit (HOSPITAL_COMMUNITY): Payer: Self-pay | Admitting: Cardiology

## 2019-12-12 ENCOUNTER — Other Ambulatory Visit (HOSPITAL_COMMUNITY): Payer: Self-pay | Admitting: Cardiology

## 2020-02-09 ENCOUNTER — Other Ambulatory Visit (HOSPITAL_COMMUNITY): Payer: Self-pay | Admitting: Cardiology

## 2020-03-17 ENCOUNTER — Other Ambulatory Visit (HOSPITAL_COMMUNITY): Payer: Self-pay | Admitting: Cardiology

## 2020-03-31 ENCOUNTER — Other Ambulatory Visit: Payer: Self-pay | Admitting: Cardiovascular Disease

## 2020-03-31 MED ORDER — CLOPIDOGREL BISULFATE 75 MG PO TABS: 75.0000 mg | ORAL_TABLET | Freq: Every day | ORAL | 1 refills | Status: DC

## 2020-03-31 NOTE — Telephone Encounter (Signed)
Rx has been sent to the pharmacy electronically. ° °

## 2020-03-31 NOTE — Telephone Encounter (Signed)
*  STAT* If patient is at the pharmacy, call can be transferred to refill team.   1. Which medications need to be refilled? (please list name of each medication and dose if known) clopidogrel (PLAVIX) 75 MG tablet  2. Which pharmacy/location (including street and city if local pharmacy) is medication to be sent to? [facility] [place], [place] - [address] AT Portal to Registered [facility] Sites  3. Do they need a 30 day or 90 day supply? 90 day

## 2020-04-11 ENCOUNTER — Other Ambulatory Visit (HOSPITAL_COMMUNITY): Payer: Self-pay | Admitting: Cardiology

## 2020-12-10 IMAGING — CT CT ABD-PELV W/O
2 of 4 series · 16 of 46 positions shown, 18 images · non-contrast
Comparison: None.

CLINICAL DATA: Epigastric and chest pain

EXAM:
CT ABDOMEN AND PELVIS WITHOUT CONTRAST
TECHNIQUE: Multidetector CT imaging of the abdomen and pelvis was performed
following the standard protocol without IV contrast.

[Series 3: a/p w/o 5mm · axial · non-contrast · 0.64mm/px · z∈[+724,+1144]mm · 13 of 92 slices shown, 15 images]
[im 4/92  soft-tissue]
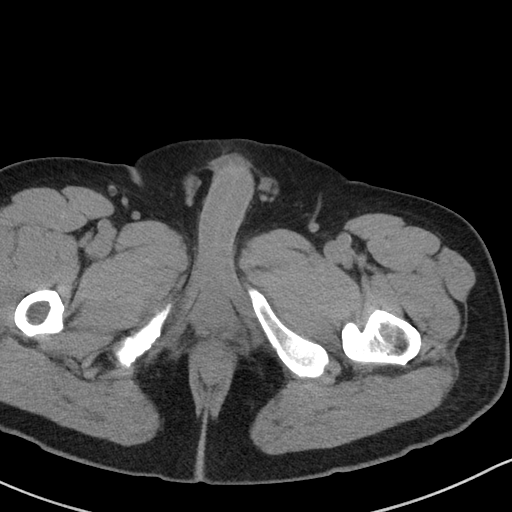
[im 4/92  bone]
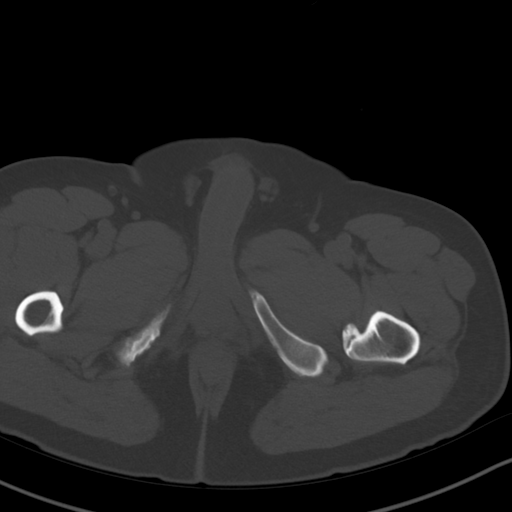
[im 11/92  soft-tissue]
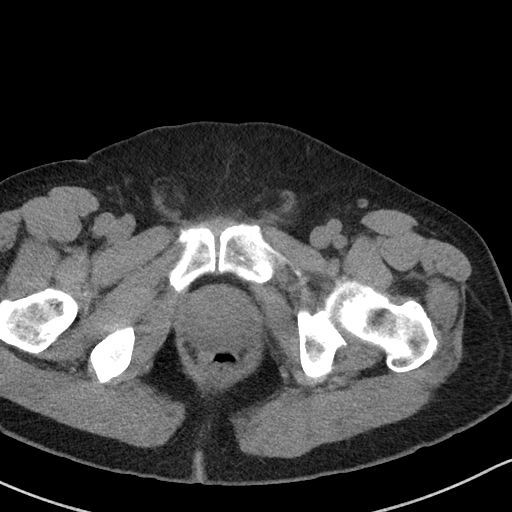
[im 19/92  soft-tissue]
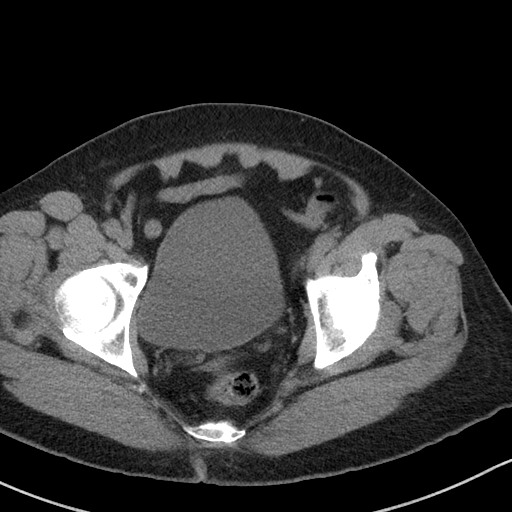
[im 26/92  soft-tissue]
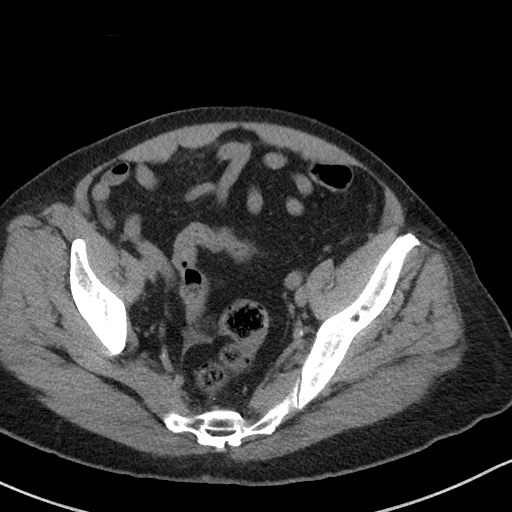
[im 33/92  soft-tissue]
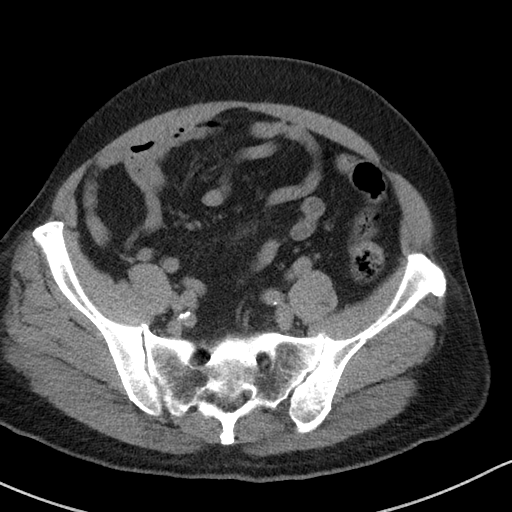
[im 41/92  soft-tissue]
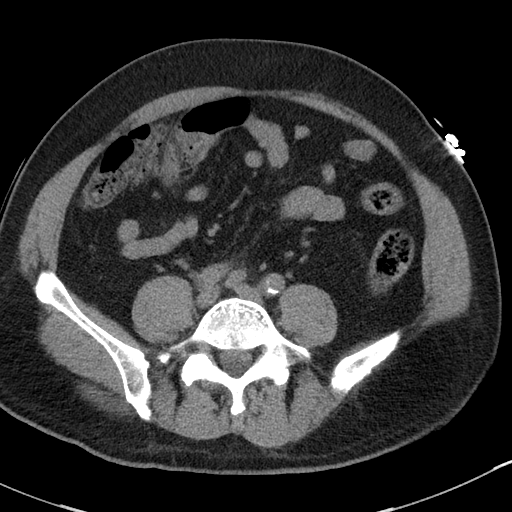
[im 48/92  soft-tissue]
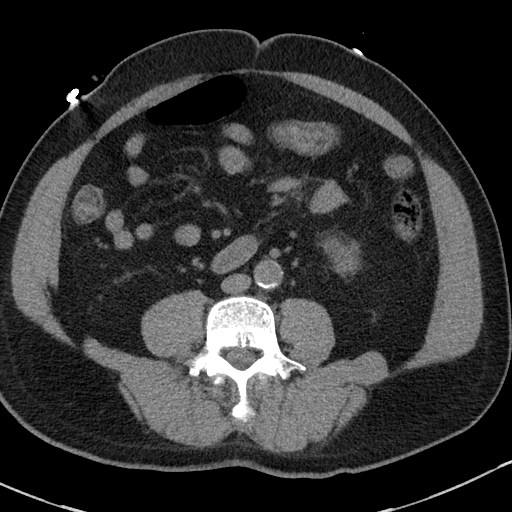
[im 51/92  soft-tissue]
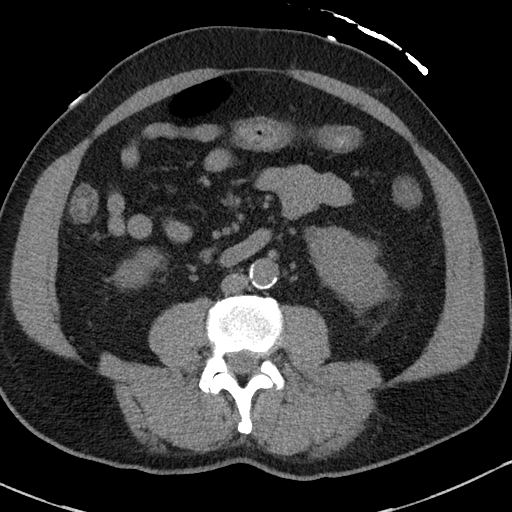
[im 59/92  soft-tissue]
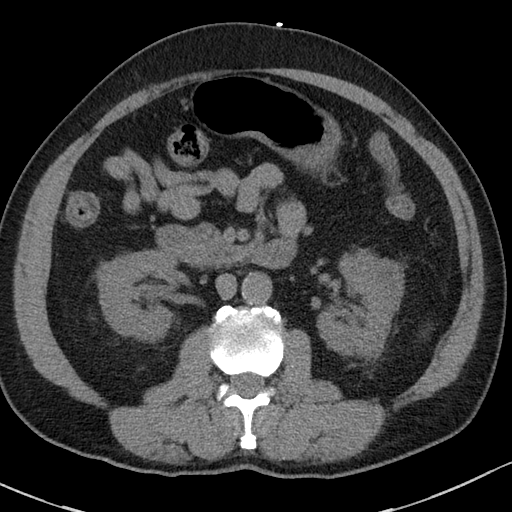
[im 59/92  bone]
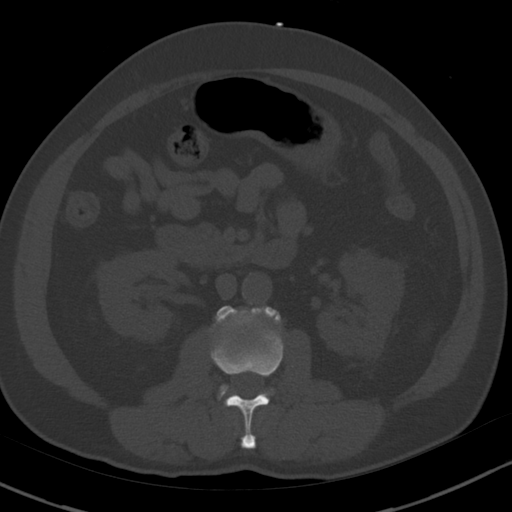
[im 66/92  soft-tissue]
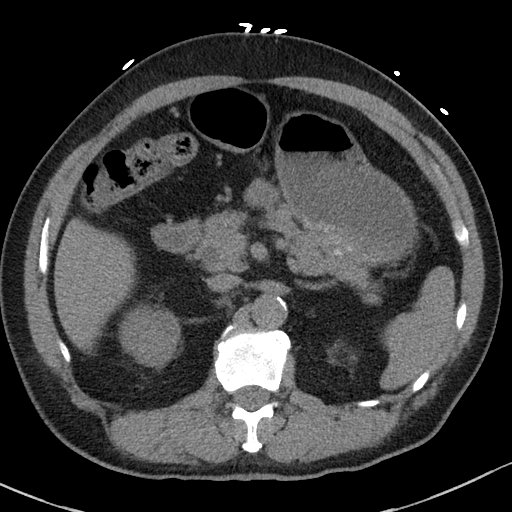
[im 73/92  soft-tissue]
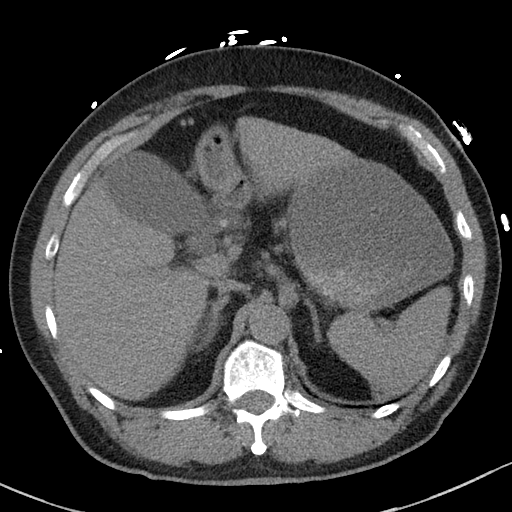
[im 81/92  soft-tissue]
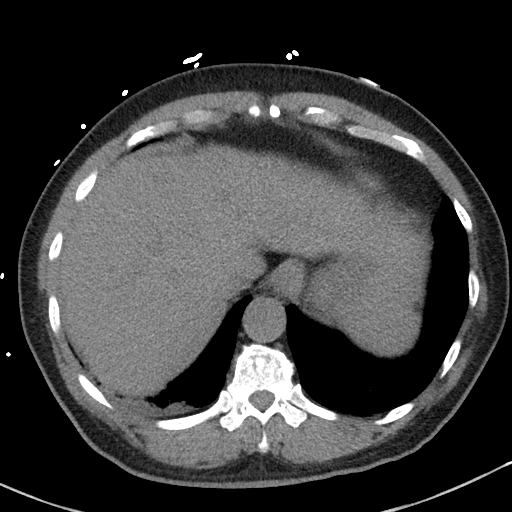
[im 88/92  soft-tissue]
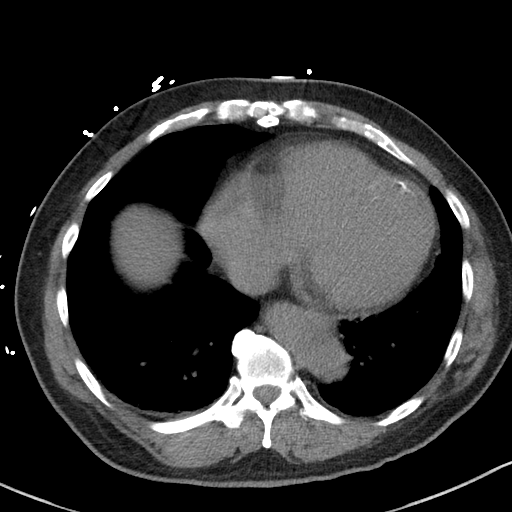

[Series 6: a/p w/o cor · coronal · non-contrast · 0.70mm/px · 3 of 151 slices shown]
[im 51/151  soft-tissue]
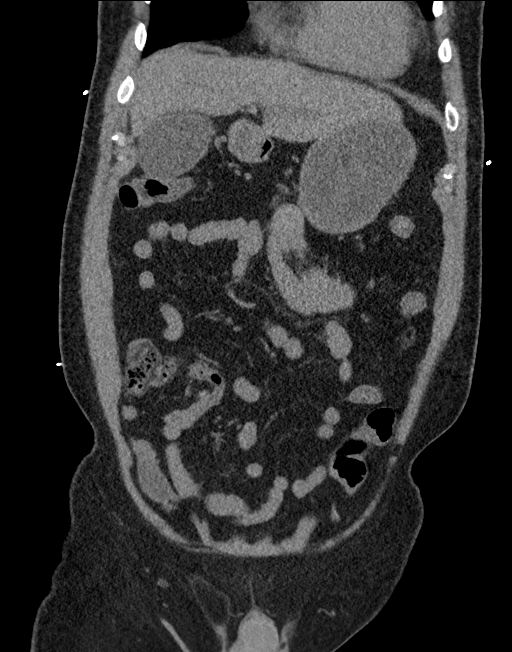
[im 67/151  soft-tissue]
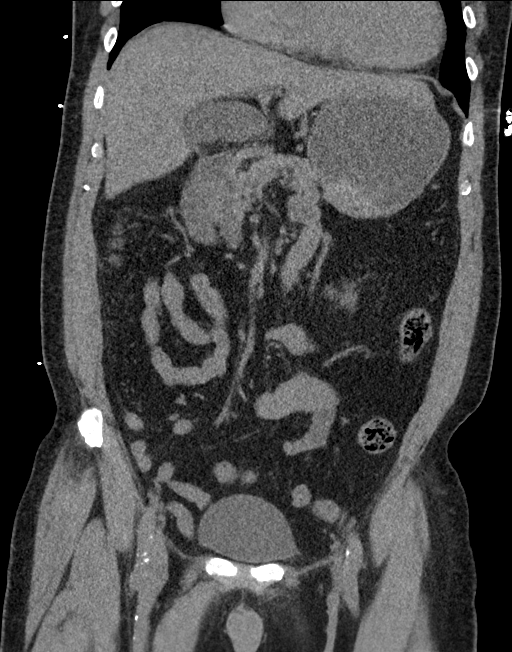
[im 84/151  soft-tissue]
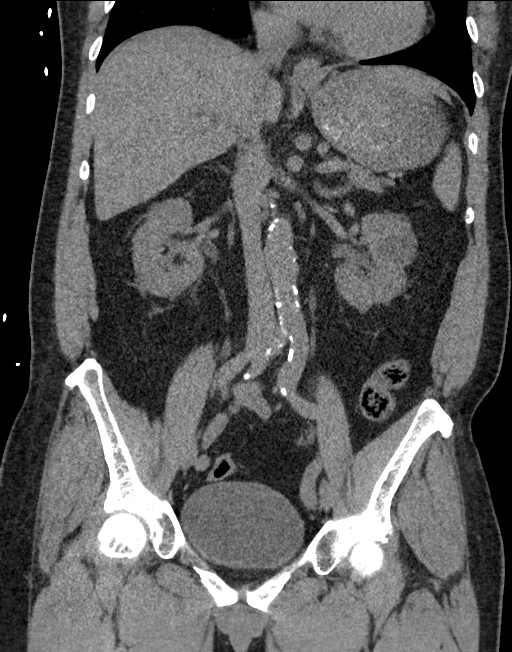

[16 of 46 positions shown; findings below may reference images not displayed]

FINDINGS: Lower chest: Mild right basilar atelectasis is noted.

Hepatobiliary: Liver is within normal limits. Gallbladder is well
distended. There are gallstones identified in the region of the
gallbladder neck as well as a density noted in the distal aspect of
the common bile duct in the region of the head of the pancreas. This
is consistent with a stone and likely the etiology of the patient's
underlying discomfort. Common bile duct measures 15 mm.

Pancreas: Pancreas is otherwise within normal limits.

Spleen: Spleen is unremarkable.

Adrenals/Urinary Tract: Adrenal glands are unremarkable. No renal
calculi or obstructive changes are seen. A hypodense lesion is noted
within the left kidney likely representing a cyst but incompletely
evaluated on this exam. This measures 3 cm. No ureteral stones are
seen. The bladder is well distended.

Stomach/Bowel: The colon is predominately decompressed. The appendix
is within normal limits. No small bowel or gastric abnormality is
seen.

Vascular/Lymphatic: Aortic atherosclerosis. No enlarged abdominal or
pelvic lymph nodes.

Reproductive: Prostate is unremarkable.

Other: No abdominal wall hernia or abnormality. No abdominopelvic
ascites.

Musculoskeletal: Degenerative changes of the lumbar spine are seen.
IMPRESSION: Evidence of cholelithiasis within the cystic duct as well as the
distal common bile duct with prominence of the common bile duct
measuring 15 mm. This corresponds to the patient's elevated LFTs and
elevated bilirubin and is likely the etiology of the patient's
underlying discomfort.

Hypodensity within the left kidney likely representing a cyst but
incompletely evaluated.

Normal-appearing appendix.

## 2021-02-23 IMAGING — US US RENAL
1 series · 14 of 25 positions shown · non-contrast
Comparison: CT scan 08/24/2018

CLINICAL DATA: Stage 3 chronic kidney disease

EXAM:
RENAL / URINARY TRACT ULTRASOUND COMPLETE

[Series 1: us renal · 0.23mm/px · 14 of 44 slices shown]
[im 1/44]
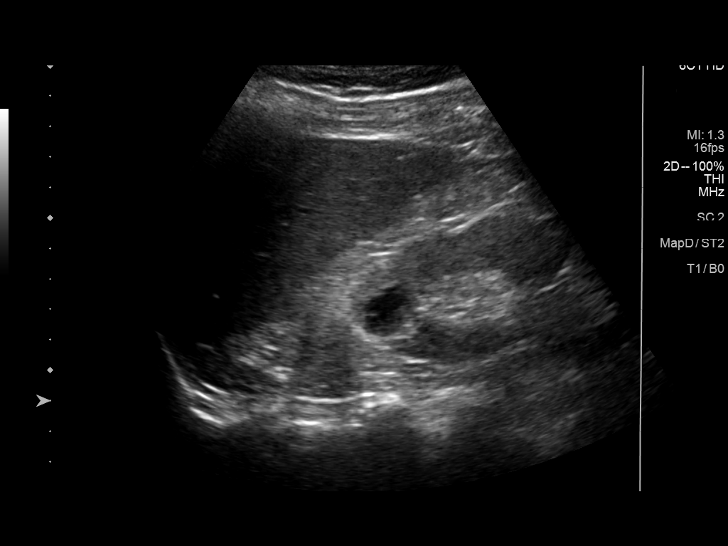
[im 4/44]
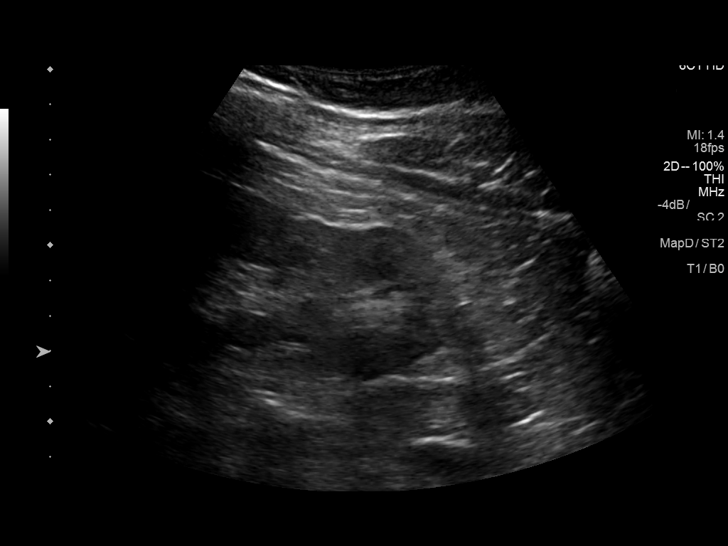
[im 8/44]
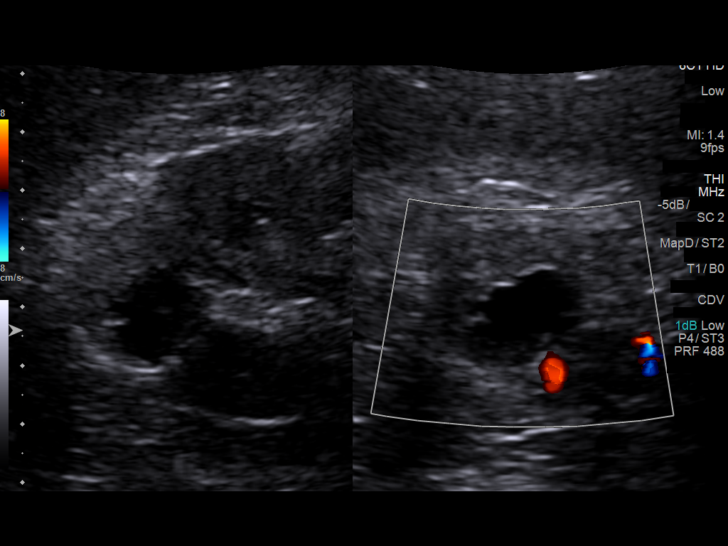
[im 11/44]
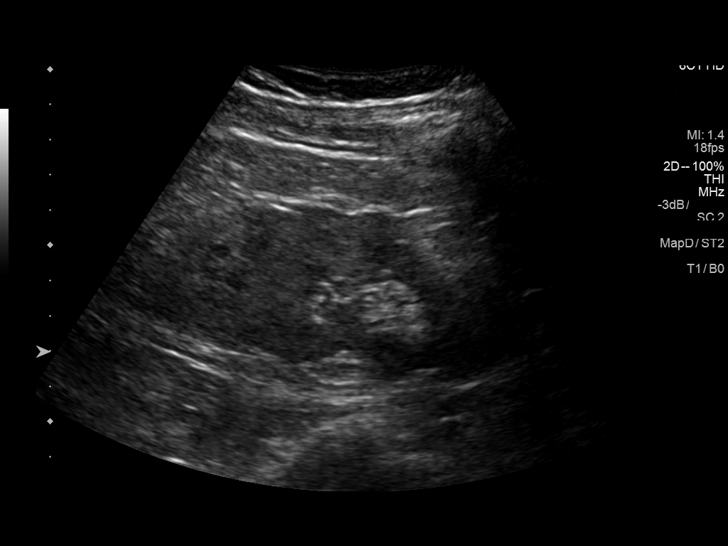
[im 15/44]
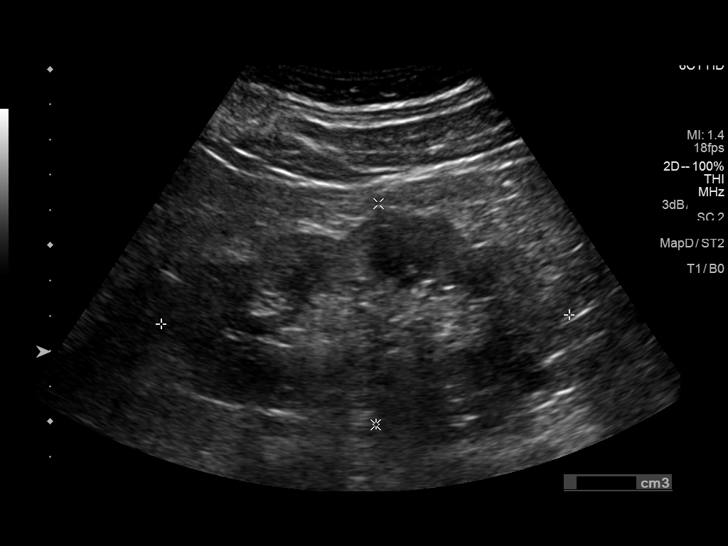
[im 17/44]
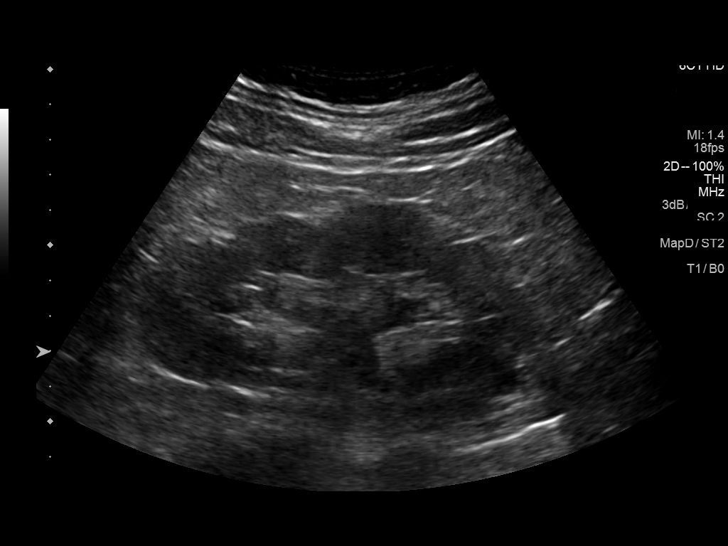
[im 20/44]
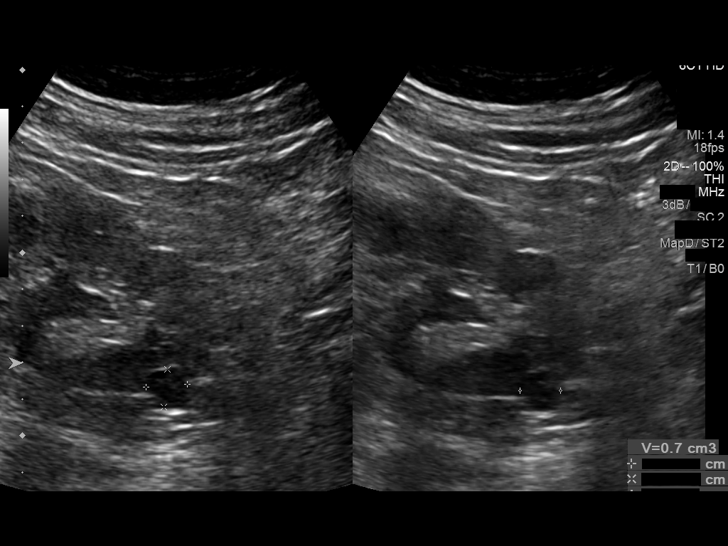
[im 24/44]
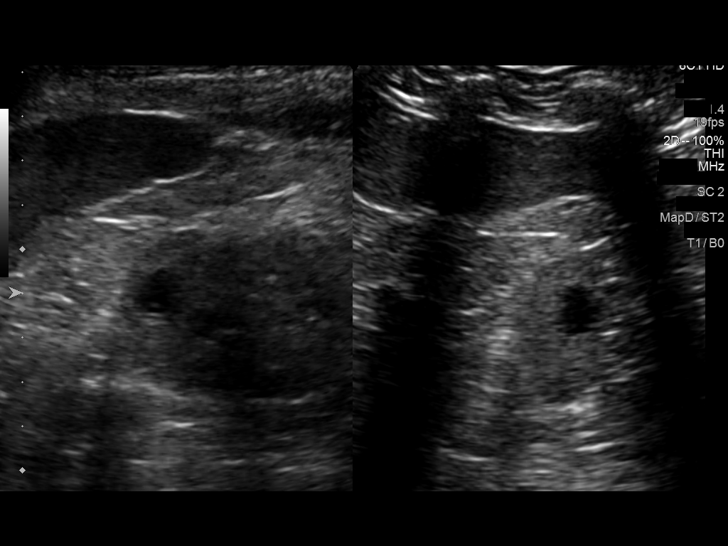
[im 27/44]
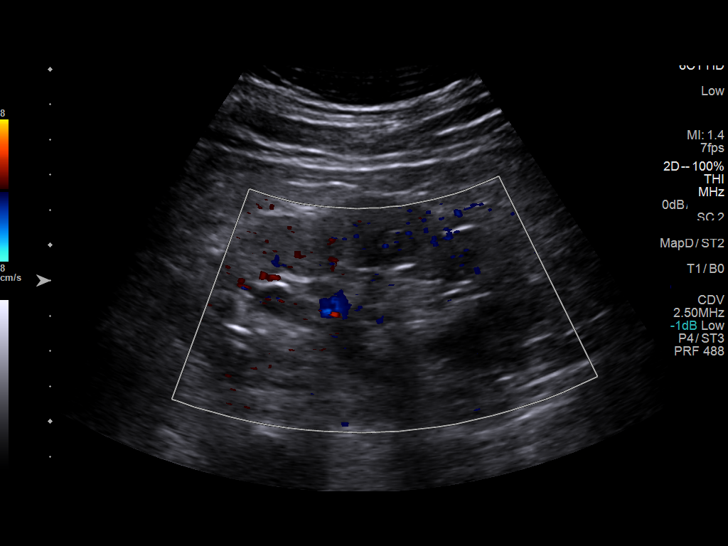
[im 29/44]
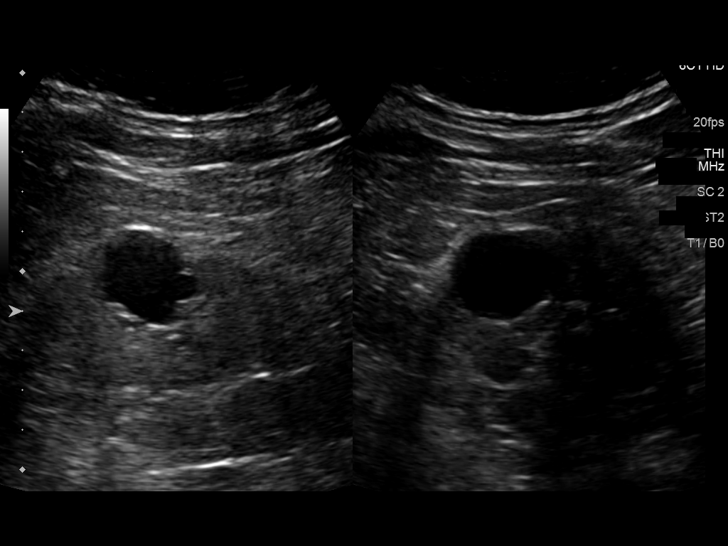
[im 33/44]
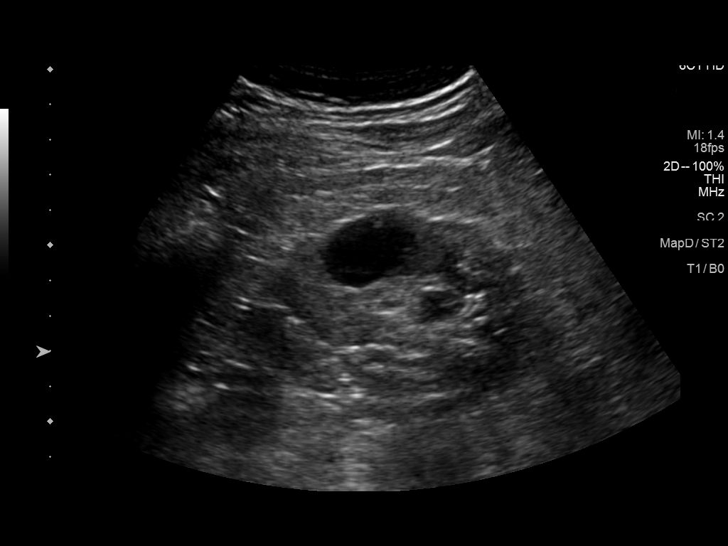
[im 36/44]
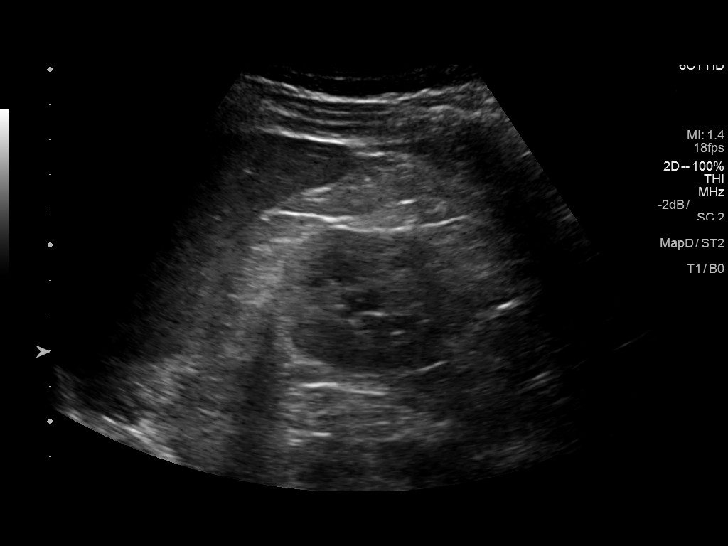
[im 40/44]
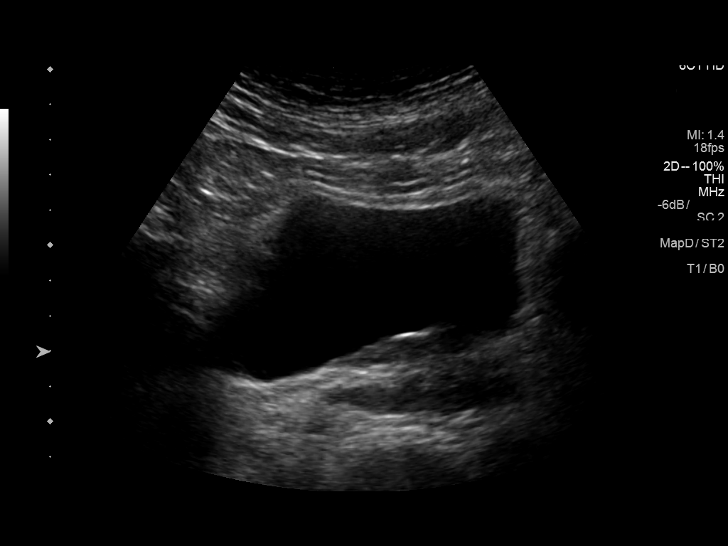
[im 44/44]
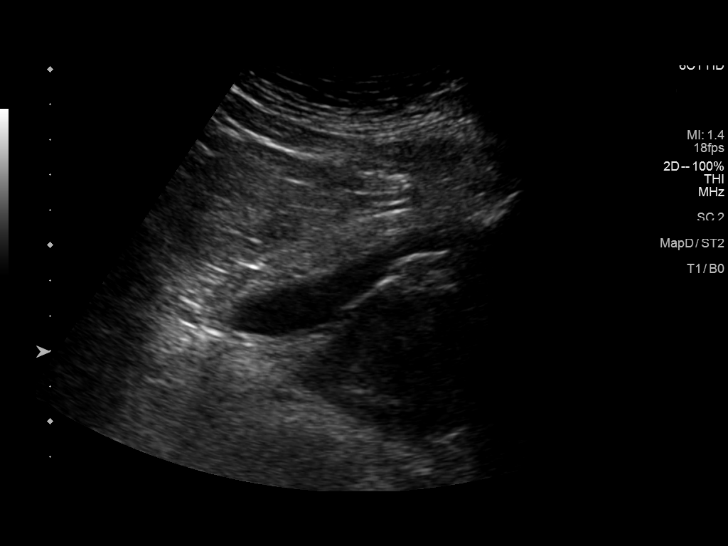

[14 of 25 positions shown; findings below may reference images not displayed]

FINDINGS: Right Kidney:

Renal measurements: 11.1 by 4.6 by 4.2 cm = volume: 114 mL .
Echogenicity within normal limits. No solid mass or hydronephrosis
identified. A right kidney upper pole mildly complex cyst measures
1.6 by 1.9 by 1.8 cm. This has no appreciable blood flow.

Left Kidney:

Renal measurements: 11.6 by 6.3 by 4.7 cm = volume: 177 mL.
Echogenicity within normal limits. No solid mass or hydronephrosis
identified. A left mid kidney mildly complex cyst potentially with a
faint marginal calcification measures 2.3 by 2.6 by 3.0 cm. There is
a smaller 1.0 cm left kidney upper pole cyst and a 1 cm left kidney
lower pole cyst.

Bladder:

Appears normal for degree of bladder distention. No significant
postvoid residual. The prostate gland measures 5.9 by 4.7 by 4.4 cm
(volume = 64 cm^3).
IMPRESSION: 1. Complex 3.0 cm left mid kidney cyst with no visible internal
blood flow.
2. Complex right kidney upper pole cyst likewise has no appreciable
blood flow.
3. No renal calculi or solid renal mass appreciated.
4. Mild prostatomegaly.

## 2021-05-01 ENCOUNTER — Other Ambulatory Visit: Payer: Self-pay | Admitting: Cardiovascular Disease
